# Patient Record
Sex: Female | Born: 1960 | Race: White | Hispanic: No | Marital: Married | Smoking: Never smoker
Health system: Southern US, Community
[De-identification: ages and names within clinical notes are randomized; demographics above are authoritative.]

## PROBLEM LIST (undated history)

## (undated) DIAGNOSIS — Z9889 Other specified postprocedural states: Secondary | ICD-10-CM

## (undated) DIAGNOSIS — N6019 Diffuse cystic mastopathy of unspecified breast: Secondary | ICD-10-CM

## (undated) DIAGNOSIS — IMO0002 Reserved for concepts with insufficient information to code with codable children: Secondary | ICD-10-CM

## (undated) DIAGNOSIS — I499 Cardiac arrhythmia, unspecified: Secondary | ICD-10-CM

## (undated) DIAGNOSIS — Z803 Family history of malignant neoplasm of breast: Secondary | ICD-10-CM

## (undated) DIAGNOSIS — Z9289 Personal history of other medical treatment: Secondary | ICD-10-CM

## (undated) HISTORY — PX: BOWEL RESECTION: SHX1257

## (undated) HISTORY — DX: Diffuse cystic mastopathy of unspecified breast: N60.19

## (undated) HISTORY — PX: PARTIAL HYSTERECTOMY: SHX80

## (undated) HISTORY — PX: ROTATOR CUFF REPAIR: SHX139

## (undated) HISTORY — PX: BREAST BIOPSY: SHX20

## (undated) HISTORY — DX: Family history of malignant neoplasm of breast: Z80.3

## (undated) HISTORY — DX: Other specified postprocedural states: Z98.890

## (undated) HISTORY — DX: Reserved for concepts with insufficient information to code with codable children: IMO0002

## (undated) HISTORY — DX: Cardiac arrhythmia, unspecified: I49.9

## (undated) HISTORY — PX: KNEE SURGERY: SHX244

## (undated) HISTORY — DX: Personal history of other medical treatment: Z92.89

---

## 1998-05-27 ENCOUNTER — Ambulatory Visit (HOSPITAL_BASED_OUTPATIENT_CLINIC_OR_DEPARTMENT_OTHER): Admission: RE | Admit: 1998-05-27 | Discharge: 1998-05-27 | Payer: Self-pay | Admitting: Orthopedic Surgery

## 1998-12-06 ENCOUNTER — Encounter: Payer: Self-pay | Admitting: *Deleted

## 1998-12-06 ENCOUNTER — Ambulatory Visit (HOSPITAL_COMMUNITY): Admission: RE | Admit: 1998-12-06 | Discharge: 1998-12-06 | Payer: Self-pay | Admitting: *Deleted

## 2000-04-13 ENCOUNTER — Encounter: Admission: RE | Admit: 2000-04-13 | Discharge: 2000-04-13 | Payer: Self-pay | Admitting: Neurology

## 2000-04-13 ENCOUNTER — Encounter: Payer: Self-pay | Admitting: Neurology

## 2001-07-21 ENCOUNTER — Encounter: Admission: RE | Admit: 2001-07-21 | Discharge: 2001-07-21 | Payer: Self-pay | Admitting: Neurology

## 2001-07-21 ENCOUNTER — Encounter: Payer: Self-pay | Admitting: Neurology

## 2001-11-19 ENCOUNTER — Emergency Department (HOSPITAL_COMMUNITY): Admission: EM | Admit: 2001-11-19 | Discharge: 2001-11-19 | Payer: Self-pay

## 2002-10-12 ENCOUNTER — Encounter: Admission: RE | Admit: 2002-10-12 | Discharge: 2002-10-12 | Payer: Self-pay | Admitting: Neurology

## 2002-10-12 ENCOUNTER — Encounter: Payer: Self-pay | Admitting: Neurology

## 2003-11-19 ENCOUNTER — Encounter: Admission: RE | Admit: 2003-11-19 | Discharge: 2003-11-19 | Payer: Self-pay | Admitting: Neurology

## 2004-09-18 ENCOUNTER — Encounter: Admission: RE | Admit: 2004-09-18 | Discharge: 2004-09-18 | Payer: Self-pay | Admitting: Neurology

## 2004-12-01 ENCOUNTER — Encounter: Admission: RE | Admit: 2004-12-01 | Discharge: 2004-12-01 | Payer: Self-pay | Admitting: Neurology

## 2005-06-08 ENCOUNTER — Encounter: Admission: RE | Admit: 2005-06-08 | Discharge: 2005-06-08 | Payer: Self-pay | Admitting: Neurology

## 2006-01-15 ENCOUNTER — Encounter: Admission: RE | Admit: 2006-01-15 | Discharge: 2006-01-15 | Payer: Self-pay | Admitting: Neurology

## 2006-10-05 ENCOUNTER — Encounter: Admission: RE | Admit: 2006-10-05 | Discharge: 2006-10-05 | Payer: Self-pay | Admitting: Specialist

## 2007-03-02 ENCOUNTER — Encounter: Admission: RE | Admit: 2007-03-02 | Discharge: 2007-03-02 | Payer: Self-pay | Admitting: Neurology

## 2007-11-01 ENCOUNTER — Encounter: Admission: RE | Admit: 2007-11-01 | Discharge: 2007-11-01 | Payer: Self-pay | Admitting: Specialist

## 2008-03-27 ENCOUNTER — Encounter: Admission: RE | Admit: 2008-03-27 | Discharge: 2008-03-27 | Payer: Self-pay | Admitting: Neurology

## 2008-04-06 ENCOUNTER — Encounter: Admission: RE | Admit: 2008-04-06 | Discharge: 2008-04-06 | Payer: Self-pay | Admitting: Neurology

## 2009-03-28 ENCOUNTER — Encounter: Admission: RE | Admit: 2009-03-28 | Discharge: 2009-03-28 | Payer: Self-pay | Admitting: Neurology

## 2009-04-01 ENCOUNTER — Encounter: Admission: RE | Admit: 2009-04-01 | Discharge: 2009-04-01 | Payer: Self-pay | Admitting: Neurology

## 2010-01-29 ENCOUNTER — Encounter: Admission: RE | Admit: 2010-01-29 | Discharge: 2010-01-29 | Payer: Self-pay | Admitting: Neurology

## 2010-10-21 ENCOUNTER — Encounter
Admission: RE | Admit: 2010-10-21 | Discharge: 2010-10-21 | Payer: Self-pay | Source: Home / Self Care | Attending: Family Medicine | Admitting: Family Medicine

## 2011-01-22 ENCOUNTER — Emergency Department (HOSPITAL_COMMUNITY): Payer: No Typology Code available for payment source

## 2011-01-22 ENCOUNTER — Emergency Department (HOSPITAL_COMMUNITY)
Admission: EM | Admit: 2011-01-22 | Discharge: 2011-01-22 | Disposition: A | Discharge status: Home / Self Care | Payer: No Typology Code available for payment source | Attending: Emergency Medicine | Admitting: Emergency Medicine

## 2011-01-22 DIAGNOSIS — IMO0002 Reserved for concepts with insufficient information to code with codable children: Secondary | ICD-10-CM | POA: Insufficient documentation

## 2011-01-22 DIAGNOSIS — M542 Cervicalgia: Secondary | ICD-10-CM | POA: Insufficient documentation

## 2011-01-22 DIAGNOSIS — M79609 Pain in unspecified limb: Secondary | ICD-10-CM | POA: Insufficient documentation

## 2011-01-22 DIAGNOSIS — S139XXA Sprain of joints and ligaments of unspecified parts of neck, initial encounter: Secondary | ICD-10-CM | POA: Insufficient documentation

## 2011-01-22 DIAGNOSIS — M545 Low back pain, unspecified: Secondary | ICD-10-CM | POA: Insufficient documentation

## 2011-01-22 DIAGNOSIS — M25519 Pain in unspecified shoulder: Secondary | ICD-10-CM | POA: Insufficient documentation

## 2011-01-22 DIAGNOSIS — S335XXA Sprain of ligaments of lumbar spine, initial encounter: Secondary | ICD-10-CM | POA: Insufficient documentation

## 2011-10-21 ENCOUNTER — Other Ambulatory Visit: Payer: Self-pay | Admitting: Family Medicine

## 2011-10-21 DIAGNOSIS — Z1231 Encounter for screening mammogram for malignant neoplasm of breast: Secondary | ICD-10-CM

## 2011-10-29 ENCOUNTER — Ambulatory Visit
Admission: RE | Admit: 2011-10-29 | Discharge: 2011-10-29 | Disposition: A | Discharge status: Home / Self Care | Payer: BC Managed Care – PPO | Source: Ambulatory Visit | Attending: Family Medicine | Admitting: Family Medicine

## 2011-10-29 DIAGNOSIS — Z1231 Encounter for screening mammogram for malignant neoplasm of breast: Secondary | ICD-10-CM

## 2012-01-05 ENCOUNTER — Other Ambulatory Visit: Payer: Self-pay | Admitting: Family Medicine

## 2012-01-05 DIAGNOSIS — N6009 Solitary cyst of unspecified breast: Secondary | ICD-10-CM

## 2012-01-06 ENCOUNTER — Ambulatory Visit
Admission: RE | Admit: 2012-01-06 | Discharge: 2012-01-06 | Disposition: A | Discharge status: Home / Self Care | Payer: BC Managed Care – PPO | Source: Ambulatory Visit | Attending: Family Medicine | Admitting: Family Medicine

## 2012-01-06 ENCOUNTER — Other Ambulatory Visit: Payer: Self-pay | Admitting: Family Medicine

## 2012-01-06 ENCOUNTER — Other Ambulatory Visit: Payer: Self-pay | Admitting: *Deleted

## 2012-01-06 DIAGNOSIS — N6009 Solitary cyst of unspecified breast: Secondary | ICD-10-CM

## 2012-01-08 ENCOUNTER — Other Ambulatory Visit: Payer: BC Managed Care – PPO

## 2012-03-23 ENCOUNTER — Other Ambulatory Visit: Payer: Self-pay | Admitting: Family Medicine

## 2012-03-23 DIAGNOSIS — N63 Unspecified lump in unspecified breast: Secondary | ICD-10-CM

## 2012-03-30 ENCOUNTER — Ambulatory Visit
Admission: RE | Admit: 2012-03-30 | Discharge: 2012-03-30 | Disposition: A | Discharge status: Home / Self Care | Payer: BC Managed Care – PPO | Source: Ambulatory Visit | Attending: Family Medicine | Admitting: Family Medicine

## 2012-03-30 DIAGNOSIS — N63 Unspecified lump in unspecified breast: Secondary | ICD-10-CM

## 2012-11-21 ENCOUNTER — Other Ambulatory Visit: Payer: Self-pay | Admitting: Neurology

## 2012-11-21 DIAGNOSIS — Z1231 Encounter for screening mammogram for malignant neoplasm of breast: Secondary | ICD-10-CM

## 2012-12-20 ENCOUNTER — Ambulatory Visit
Admission: RE | Admit: 2012-12-20 | Discharge: 2012-12-20 | Disposition: A | Discharge status: Home / Self Care | Payer: BC Managed Care – PPO | Source: Ambulatory Visit | Attending: Neurology | Admitting: Neurology

## 2012-12-20 DIAGNOSIS — Z1231 Encounter for screening mammogram for malignant neoplasm of breast: Secondary | ICD-10-CM

## 2013-05-09 HISTORY — PX: COLONOSCOPY: SHX5424

## 2013-09-08 ENCOUNTER — Encounter: Payer: Self-pay | Admitting: Interventional Cardiology

## 2013-09-08 ENCOUNTER — Telehealth: Payer: Self-pay | Admitting: Interventional Cardiology

## 2013-11-07 ENCOUNTER — Encounter: Payer: Self-pay | Admitting: Cardiology

## 2013-11-07 DIAGNOSIS — D518 Other vitamin B12 deficiency anemias: Secondary | ICD-10-CM

## 2013-11-07 DIAGNOSIS — M542 Cervicalgia: Secondary | ICD-10-CM | POA: Insufficient documentation

## 2013-11-07 DIAGNOSIS — I480 Paroxysmal atrial fibrillation: Secondary | ICD-10-CM | POA: Insufficient documentation

## 2013-11-07 DIAGNOSIS — I4891 Unspecified atrial fibrillation: Secondary | ICD-10-CM

## 2013-11-20 ENCOUNTER — Telehealth: Payer: Self-pay | Admitting: Interventional Cardiology

## 2013-11-20 ENCOUNTER — Ambulatory Visit (INDEPENDENT_AMBULATORY_CARE_PROVIDER_SITE_OTHER): Payer: BC Managed Care – PPO | Admitting: Interventional Cardiology

## 2013-11-20 ENCOUNTER — Encounter: Payer: Self-pay | Admitting: Interventional Cardiology

## 2013-11-20 ENCOUNTER — Encounter (INDEPENDENT_AMBULATORY_CARE_PROVIDER_SITE_OTHER): Payer: Self-pay

## 2013-11-20 VITALS — BP 128/77 | HR 76 | Ht 72.0 in | Wt 155.8 lb

## 2013-11-20 DIAGNOSIS — I4891 Unspecified atrial fibrillation: Secondary | ICD-10-CM

## 2013-11-20 DIAGNOSIS — R079 Chest pain, unspecified: Secondary | ICD-10-CM

## 2013-11-20 DIAGNOSIS — R42 Dizziness and giddiness: Secondary | ICD-10-CM

## 2013-11-20 MED ORDER — DILTIAZEM HCL ER COATED BEADS 180 MG PO CP24: 180.0000 mg | ORAL_CAPSULE | Freq: Every day | ORAL | Status: DC

## 2013-11-20 NOTE — Telephone Encounter (Signed)
Pt reports her "heart has been acting up" last couple weeks. She describes driving in car last Friday when her vision became "white blurry" and her heart "felt like it flipped over in my chest". She immediately had a severe headache and pulled off the road. Currently she is not symptomatic. Moved her annual appointment from later this week to today with Dr. Eldridge Dace, 2:15 pm

## 2013-11-20 NOTE — Telephone Encounter (Signed)
New message    C/o blurred vision on Friday. Happen when she was on highway. Should her appt be move up .

## 2013-11-20 NOTE — Patient Instructions (Signed)
Your physician recommends that you continue on your current medications as directed. Please refer to the Current Medication list given to you today.  I sent in a 90 day supply with 3 Refills for your Cartia XT. It should be ready for you to pick up later today.  Your physician wants you to follow-up in: 1 Year with Dr Lyn Hollingshead will receive a reminder letter in the mail two months in advance. If you don't receive a letter, please call our office to schedule the follow-up appointment.

## 2013-11-20 NOTE — Telephone Encounter (Signed)
Printed this and last OV for Dr Eldridge Dace. To Amy To make aware we saw pt today

## 2013-11-20 NOTE — Progress Notes (Signed)
Patient ID: Melissa Fritz, female   DOB: April 11, 1961, 53 y.o.   MRN: [id number]    [address] [place], [place]  [address] Phone: [phone] Fax:  [phone]  Date:  11/20/2013   ID:  Melissa, Fritz [birth year], MRN [id number]  PCP:  Allean Found, MD      History of Present Illness: TAHISHA Fritz is a 53 y.o. female who has had AFib in the past, and other arrhythmias when she was very young. She was hospitalized at a young age for arrhythmic issues. Sx have been under control. If she misses a medicine, she will have a few minutes of palpitations. No syncope or lightheadedness. She has been under a lot of stress recently. SHe has a history of arrhythmia dating back to many years ago. Atrial Fibrillation F/U:  c/o Dizziness while getting up from sitting position.  Denies : Chest pain.  Leg edema.  Orthopnea.  Shortness of breath.  Syncope.    She had one episode of palpitations.  It feels like her heart flips in her chest.  She had an episode associated with blurry vision and lightheadedness.  After a few minutes, she felt normal.  She hfeels some pressure in her chest as well with walking up stairs.  She has not had a prolonged episode of AFib.     Wt Readings from Last 3 Encounters:  11/20/13 155 lb 12.8 oz (70.67 kg)     Past Medical History  Diagnosis Date  . Family history of breast cancer   . Fibrocystic breast disease   . Arrhythmia     afib  . DDD (degenerative disc disease)   . History of colonoscopy     Current Outpatient Prescriptions  Medication Sig Dispense Refill  . aspirin 81 MG tablet Take 81 mg by mouth daily.      . Cyanocobalamin (VITAMIN B-12) 1000 MCG/15ML LIQD Take 1 mL by mouth every 21 ( twenty-one) days.      Marland Kitchen diltiazem (CARTIA XT) 180 MG 24 hr capsule Take 180 mg by mouth daily.      Marland Kitchen HYDROcodone-acetaminophen (NORCO/VICODIN) 5-325 MG per tablet Take 1 tablet by mouth every 6 (six) hours as needed for moderate pain.       . Multiple Vitamins-Minerals (MULTIVITAMIN PO) Take by mouth daily.      . vitamin E 400 UNIT capsule Take 400 Units by mouth daily.       No current facility-administered medications for this visit.    Allergies:    Allergies  Allergen Reactions  . Demerol [Meperidine] Itching    Social History:  The patient  reports that she has never smoked. She does not have any smokeless tobacco history on file. She reports that she drinks alcohol. She reports that she does not use illicit drugs.   Family History:  The patient's family history includes Atrial fibrillation in her brother; Breast cancer in her maternal grandmother and mother; CVA in her paternal grandfather; Glaucoma in her father; Heart disease in her brother and brother; Polymyalgia rheumatica in her mother and sister.   ROS:  Please see the history of present illness.  No nausea, vomiting.  No fevers, chills.  No focal weakness.  No dysuria. One episode of palpitations.   All other systems reviewed and negative.   PHYSICAL EXAM: VS:  BP 128/77  Pulse 76  Ht 6' (1.829 m)  Wt 155 lb 12.8 oz (70.67 kg)  BMI 21.13 kg/m2 Well  nourished, well developed, in no acute distress HEENT: normal Neck: no JVD, no carotid bruits Cardiac:  normal S1, S2; RRR;  Lungs:  clear to auscultation bilaterally, no wheezing, rhonchi or rales Abd: soft, nontender, no hepatomegaly Ext: no edema Skin: warm and dry Neuro:   no focal abnormalities noted  EKG:   Normal sinus rhythm, no ST segment changes  ASSESSMENT AND PLAN:  Atrial fibrillation  Refill Cartia XT Capsule Extended Release 24 Hour, 180 MG, 1 capsule, Orally, Once a day, 90 days, 90, Refills 3 Continue Aspirin EC Lo-Dose Tablet Delayed Release, 81 MG, 1 tablet, Orally, Once a day, 30 day(s), 30 Sx are better controlled, except for the episode described above. We discussed a referral to Dr. Johney Frame for further. Would minimize caffeine and alcohol. She will think about seeing Dr.  Johney Frame. She will continue to monitor sx and let us know.   Lightheadedness: PVCs most likely.  Unclear why she had blurry vision unless there were a few PVCs strung together. It resolves spontaneously. She has a warning. She knows to try to sit down or pull off to the side of the road, depending on what she is doing when this symptom happens.  We discussed putting a monitor on her. She is not interested at this time.  Chest pressure: She has felt a little short of breath and some chest pressure while walking upstairs. She recently underwent a procedure for lower back trouble. Her walking has been limited since that procedure. She is trying to get back into more walking these days.  She is not concerned about this symptom at this time. She is not interested in any further workup. She is mostly concerned about the episode of palpitations with lightheadedness described above.   Signed, Fredric Mare, MD, [facility] 11/20/2013 3:02 PM

## 2013-11-21 ENCOUNTER — Ambulatory Visit: Payer: BC Managed Care – PPO | Admitting: Interventional Cardiology

## 2013-11-22 ENCOUNTER — Ambulatory Visit: Payer: BC Managed Care – PPO | Admitting: Interventional Cardiology

## 2014-01-11 ENCOUNTER — Other Ambulatory Visit: Payer: Self-pay

## 2014-01-11 DIAGNOSIS — Z1231 Encounter for screening mammogram for malignant neoplasm of breast: Secondary | ICD-10-CM

## 2014-01-16 ENCOUNTER — Ambulatory Visit
Admission: RE | Admit: 2014-01-16 | Discharge: 2014-01-16 | Disposition: A | Discharge status: Home / Self Care | Payer: BC Managed Care – PPO | Source: Ambulatory Visit

## 2014-01-16 DIAGNOSIS — Z1231 Encounter for screening mammogram for malignant neoplasm of breast: Secondary | ICD-10-CM

## 2014-02-16 ENCOUNTER — Encounter (HOSPITAL_COMMUNITY): Payer: Self-pay | Admitting: Emergency Medicine

## 2014-02-16 ENCOUNTER — Emergency Department (INDEPENDENT_AMBULATORY_CARE_PROVIDER_SITE_OTHER)
Admission: EM | Admit: 2014-02-16 | Discharge: 2014-02-16 | Disposition: A | Discharge status: Home / Self Care | Payer: BC Managed Care – PPO | Source: Home / Self Care | Attending: Family Medicine | Admitting: Family Medicine

## 2014-02-16 DIAGNOSIS — J329 Chronic sinusitis, unspecified: Secondary | ICD-10-CM

## 2014-02-16 DIAGNOSIS — J020 Streptococcal pharyngitis: Secondary | ICD-10-CM

## 2014-02-16 LAB — POCT RAPID STREP A: Streptococcus, Group A Screen (Direct): POSITIVE — AB

## 2014-02-16 MED ORDER — AMOXICILLIN 875 MG PO TABS: 875.0000 mg | ORAL_TABLET | Freq: Two times a day (BID) | ORAL | Status: DC

## 2014-02-16 NOTE — ED Provider Notes (Signed)
Medical screening examination/treatment/procedure(s) were performed by resident physician or non-physician practitioner and as supervising physician I was immediately available for consultation/collaboration.   Barkley Bruns MD.   Linna Hoff, MD 02/16/14 1357

## 2014-02-16 NOTE — ED Provider Notes (Signed)
CSN: [id number]     Arrival date & time 02/16/14  1210 History   First MD Initiated Contact with Patient 02/16/14 1346     Chief Complaint  Patient presents with  . Sore Throat   (Consider location/radiation/quality/duration/timing/severity/associated sxs/prior Treatment) HPI Comments: C/o green sputum  Patient is a 53 y.o. female presenting with pharyngitis and URI. The history is provided by the patient.  Sore Throat This is a new problem. The current episode started more than 2 days ago. The problem occurs constantly. The problem has been gradually worsening. Pertinent negatives include no abdominal pain and no shortness of breath. The symptoms are aggravated by swallowing. Nothing relieves the symptoms. She has tried nothing for the symptoms.  URI Presenting symptoms: congestion, cough and sore throat   Presenting symptoms: no ear pain, no facial pain, no fever and no rhinorrhea   Severity:  Severe Onset quality:  Gradual Duration:  1 week Timing:  Constant Progression:  Worsening Chronicity:  New Relieved by:  None tried Worsened by:  Nothing tried Ineffective treatments:  None tried Associated symptoms: sinus pain   Risk factors: sick contacts     Past Medical History  Diagnosis Date  . Family history of breast cancer   . Fibrocystic breast disease   . Arrhythmia     afib  . DDD (degenerative disc disease)   . History of colonoscopy    Past Surgical History  Procedure Laterality Date  . Knee surgery Right   . Rotator cuff repair    . Partial hysterectomy      one ovary remains  . Bowel resection      adhesions causing bloackage  . Colonoscopy  7/14   Family History  Problem Relation Age of Onset  . Breast cancer Mother     survivor  . Polymyalgia rheumatica Mother   . Glaucoma Father   . Polymyalgia rheumatica Sister   . Heart disease Brother   . Atrial fibrillation Brother   . Breast cancer Maternal Grandmother   . CVA Paternal Grandfather   . Heart  disease Brother    History  Substance Use Topics  . Smoking status: Never Smoker   . Smokeless tobacco: Not on file  . Alcohol Use: Yes   OB History   Grav Para Term Preterm Abortions TAB SAB Ect Mult Living                 Review of Systems  Constitutional: Positive for chills. Negative for fever.  HENT: Positive for congestion, postnasal drip, sinus pressure and sore throat. Negative for ear pain and rhinorrhea.   Respiratory: Positive for cough. Negative for shortness of breath.   Gastrointestinal: Negative for abdominal pain.    Allergies  Demerol  Home Medications   Current Outpatient Rx  Name  Route  Sig  Dispense  Refill  . aspirin 81 MG tablet   Oral   Take 81 mg by mouth daily.         Marland Kitchen diltiazem (CARTIA XT) 180 MG 24 hr capsule   Oral   Take 1 capsule (180 mg total) by mouth daily.   90 capsule   3   . HYDROcodone-acetaminophen (NORCO/VICODIN) 5-325 MG per tablet   Oral   Take 1 tablet by mouth every 6 (six) hours as needed for moderate pain.         . Multiple Vitamins-Minerals (MULTIVITAMIN PO)   Oral   Take by mouth daily.         Marland Kitchen  vitamin E 400 UNIT capsule   Oral   Take 400 Units by mouth daily.         Marland Kitchen amoxicillin (AMOXIL) 875 MG tablet   Oral   Take 1 tablet (875 mg total) by mouth 2 (two) times daily.   20 tablet   0   . Cyanocobalamin (VITAMIN B-12) 1000 MCG/15ML LIQD   Oral   Take 1 mL by mouth every 21 ( twenty-one) days.          BP 126/64  Pulse 82  Temp(Src) 98.8 F (37.1 C) (Oral)  Resp 18  SpO2 100% Physical Exam  Constitutional: She appears well-developed and well-nourished. No distress.  HENT:  Right Ear: Tympanic membrane, external ear and ear canal normal.  Left Ear: Tympanic membrane, external ear and ear canal normal.  Nose: Mucosal edema present. Right sinus exhibits maxillary sinus tenderness and frontal sinus tenderness. Left sinus exhibits maxillary sinus tenderness and frontal sinus tenderness.   Mouth/Throat: Mucous membranes are normal. Posterior oropharyngeal edema and posterior oropharyngeal erythema present. No oropharyngeal exudate.  R tonsil larger than L  Cardiovascular: Normal rate and regular rhythm.   Pulmonary/Chest: Effort normal and breath sounds normal.  Occasional non productive cough during exam  Lymphadenopathy:       Head (right side): Submandibular adenopathy present.       Head (left side): Submandibular adenopathy present.    She has no cervical adenopathy.  R submandibular lymph node more enlarged and tender than L     ED Course  Procedures (including critical care time) Labs Review Labs Reviewed  POCT RAPID STREP A ([facility]RG CARE ONLY) - Abnormal; Notable for the following:    Streptococcus, Group A Screen (Direct) POSITIVE (*)    All other components within normal limits   Imaging Review No results found.   MDM   1. Strep pharyngitis   2. Sinusitis   rx amoxicillin 875mg  BID #20. Pt is leaving for vacation today. If no improvement in next 2 days or if worsens, pt to seek help at urgent care at destination.      , NP 02/16/14 1352

## 2014-02-16 NOTE — Discharge Instructions (Signed)
Change your toothbrush in 2 days to prevent re-infection.   If you are not dramatically better in 2 days, or if you feel your cough is much worse, go to an urgent care that has x-ray capabilities while you are out of town to get checked again.    Strep Throat Strep throat is an infection of the throat caused by a bacteria named Streptococcus pyogenes. Your caregiver may call the infection streptococcal "tonsillitis" or "pharyngitis" depending on whether there are signs of inflammation in the tonsils or back of the throat. Strep throat is most common in children aged 5 15 years during the cold months of the year, but it can occur in people of any age during any season. This infection is spread from person to person (contagious) through coughing, sneezing, or other close contact. SYMPTOMS   Fever or chills.  Painful, swollen, red tonsils or throat.  Pain or difficulty when swallowing.  White or yellow spots on the tonsils or throat.  Swollen, tender lymph nodes or "glands" of the neck or under the jaw.  Red rash all over the body (rare). DIAGNOSIS  Many different infections can cause the same symptoms. A test must be done to confirm the diagnosis so the right treatment can be given. A "rapid strep test" can help your caregiver make the diagnosis in a few minutes. If this test is not available, a light swab of the infected area can be used for a throat culture test. If a throat culture test is done, results are usually available in a day or two. TREATMENT  Strep throat is treated with antibiotic medicine. HOME CARE INSTRUCTIONS   Gargle with 1 tsp of salt in 1 cup of warm water, 3 4 times per day or as needed for comfort.  Family members who also have a sore throat or fever should be tested for strep throat and treated with antibiotics if they have the strep infection.  Make sure everyone in your household washes their hands well.  Do not share food, drinking cups, or personal items that  could cause the infection to spread to others.  You may need to eat a soft food diet until your sore throat gets better.  Drink enough water and fluids to keep your urine clear or pale yellow. This will help prevent dehydration.  Get plenty of rest.  Stay home from school, daycare, or work until you have been on antibiotics for 24 hours.  Only take over-the-counter or prescription medicines for pain, discomfort, or fever as directed by your caregiver.  If antibiotics are prescribed, take them as directed. Finish them even if you start to feel better. SEEK MEDICAL CARE IF:   The glands in your neck continue to enlarge.  You develop a rash, cough, or earache.  You cough up green, yellow-brown, or bloody sputum.  You have pain or discomfort not controlled by medicines.  Your problems seem to be getting worse rather than better. SEEK IMMEDIATE MEDICAL CARE IF:   You develop any new symptoms such as vomiting, severe headache, stiff or painful neck, chest pain, shortness of breath, or trouble swallowing.  You develop severe throat pain, drooling, or changes in your voice.  You develop swelling of the neck, or the skin on the neck becomes red and tender.  You have a fever.  You develop signs of dehydration, such as fatigue, dry mouth, and decreased urination.  You become increasingly sleepy, or you cannot wake up completely. Document Released: 10/23/2000 Document Revised:  10/12/2012 Document Reviewed: 12/25/2010 [employer] Patient Information 2014 [place], [place].  Sinusitis Sinusitis is redness, soreness, and swelling (inflammation) of the paranasal sinuses. Paranasal sinuses are air pockets within the bones of your face (beneath the eyes, the middle of the forehead, or above the eyes). In healthy paranasal sinuses, mucus is able to drain out, and air is able to circulate through them by way of your nose. However, when your paranasal sinuses are inflamed, mucus and air can become  trapped. This can allow bacteria and other germs to grow and cause infection. Sinusitis can develop quickly and last only a short time (acute) or continue over a long period (chronic). Sinusitis that lasts for more than 12 weeks is considered chronic.  CAUSES  Causes of sinusitis include:  Allergies.  Structural abnormalities, such as displacement of the cartilage that separates your nostrils (deviated septum), which can decrease the air flow through your nose and sinuses and affect sinus drainage.  Functional abnormalities, such as when the small hairs (cilia) that line your sinuses and help remove mucus do not work properly or are not present. SYMPTOMS  Symptoms of acute and chronic sinusitis are the same. The primary symptoms are pain and pressure around the affected sinuses. Other symptoms include:  Upper toothache.  Earache.  Headache.  Bad breath.  Decreased sense of smell and taste.  A cough, which worsens when you are lying flat.  Fatigue.  Fever.  Thick drainage from your nose, which often is green and may contain pus (purulent).  Swelling and warmth over the affected sinuses. DIAGNOSIS  Your caregiver will perform a physical exam. During the exam, your caregiver may:  Look in your nose for signs of abnormal growths in your nostrils (nasal polyps).  Tap over the affected sinus to check for signs of infection.  View the inside of your sinuses (endoscopy) with a special imaging device with a light attached (endoscope), which is inserted into your sinuses. If your caregiver suspects that you have chronic sinusitis, one or more of the following tests may be recommended:  Allergy tests.  Nasal culture A sample of mucus is taken from your nose and sent to a lab and screened for bacteria.  Nasal cytology A sample of mucus is taken from your nose and examined by your caregiver to determine if your sinusitis is related to an allergy. TREATMENT  Most cases of acute  sinusitis are related to a viral infection and will resolve on their own within 10 days. Sometimes medicines are prescribed to help relieve symptoms (pain medicine, decongestants, nasal steroid sprays, or saline sprays).  However, for sinusitis related to a bacterial infection, your caregiver will prescribe antibiotic medicines. These are medicines that will help kill the bacteria causing the infection.  Rarely, sinusitis is caused by a fungal infection. In theses cases, your caregiver will prescribe antifungal medicine. For some cases of chronic sinusitis, surgery is needed. Generally, these are cases in which sinusitis recurs more than 3 times per year, despite other treatments. HOME CARE INSTRUCTIONS   Drink plenty of water. Water helps thin the mucus so your sinuses can drain more easily.  Use a humidifier.  Inhale steam 3 to 4 times a day (for example, sit in the bathroom with the shower running).  Apply a warm, moist washcloth to your face 3 to 4 times a day, or as directed by your caregiver.  Use saline nasal sprays to help moisten and clean your sinuses.  Take over-the-counter or prescription medicines for pain,  discomfort, or fever only as directed by your caregiver. SEEK IMMEDIATE MEDICAL CARE IF:  You have increasing pain or severe headaches.  You have nausea, vomiting, or drowsiness.  You have swelling around your face.  You have vision problems.  You have a stiff neck.  You have difficulty breathing. MAKE SURE YOU:   Understand these instructions.  Will watch your condition.  Will get help right away if you are not doing well or get worse. Document Released: 10/26/2005 Document Revised: 01/18/2012 Document Reviewed: 11/10/2011 [facility] Patient Information 2014 [place], [place].

## 2014-02-16 NOTE — ED Notes (Signed)
C/o  Body aches.  Sore throat.  Productive cough with dark green sputum.   Low grade temp.   Pt has not tried any otc meds for symptoms.

## 2014-03-25 ENCOUNTER — Other Ambulatory Visit: Payer: Self-pay | Admitting: Interventional Cardiology

## 2014-05-18 NOTE — Telephone Encounter (Signed)
Closed encounter °

## 2014-10-27 ENCOUNTER — Encounter: Payer: Self-pay | Admitting: *Deleted

## 2014-11-28 ENCOUNTER — Ambulatory Visit (INDEPENDENT_AMBULATORY_CARE_PROVIDER_SITE_OTHER): Payer: BLUE CROSS/BLUE SHIELD | Admitting: Interventional Cardiology

## 2014-11-28 ENCOUNTER — Encounter: Payer: Self-pay | Admitting: Interventional Cardiology

## 2014-11-28 VITALS — BP 130/86 | HR 91 | Ht 72.0 in | Wt 159.0 lb

## 2014-11-28 DIAGNOSIS — I4891 Unspecified atrial fibrillation: Secondary | ICD-10-CM

## 2014-11-28 MED ORDER — DILTIAZEM HCL ER COATED BEADS 180 MG PO CP24: 180.0000 mg | ORAL_CAPSULE | Freq: Every day | ORAL | Status: DC

## 2014-11-28 NOTE — Patient Instructions (Signed)
Your physician recommends that you continue on your current medications as directed. Please refer to the Current Medication list given to you today.  Your physician wants you to follow-up in: 1 year with Dr. Varanasi. You will receive a reminder letter in the mail two months in advance. If you don't receive a letter, please call our office to schedule the follow-up appointment.  

## 2014-11-28 NOTE — Progress Notes (Signed)
Patient ID: Melissa Fritz, female   DOB: 06-27-1961, 54 y.o.   MRN: [id number]    [address] [place], [place]  [address] Phone: [phone] Fax:  [phone]  Date:  11/28/2014   ID:  Melissa Fritz 1960-12-04, MRN [id number]  PCP:  Allean Found, MD      History of Present Illness: Melissa Fritz is a 54 y.o. female who has had AFib in the past, and other arrhythmias when she was very young. She was hospitalized at a young age for arrhythmic issues. Sx have been under control. If she misses a medicine, she will have a few minutes of palpitations. No syncope or lightheadedness. SHe has a history of arrhythmia dating back to many years ago. Atrial Fibrillation F/U:  c/o Dizziness while getting up from sitting position.  Denies : Chest pain.  Leg edema.  Orthopnea.  Shortness of breath.  Syncope.    She had a spinal fusion in late 2015.  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  Walking and standing are better after the surgery.  No arrhythmia during the perioperative period.     She had one episode of palpitations.  It feels like her heart flips in her chest a week ago. It resolved on its own.  No blurry vision or lightheadedness.  After a few minutes, she felt normal.  She has not felt pressure in her chest with exertion .  She has not had a prolonged episode of AFib.  No bleeding problems.    Wt Readings from Last 3 Encounters:  11/28/14 159 lb (72.122 kg)  11/20/13 155 lb 12.8 oz (70.67 kg)     Past Medical History  Diagnosis Date  . Family history of breast cancer   . Fibrocystic breast disease   . Arrhythmia     afib  . DDD (degenerative disc disease)   . History of colonoscopy     Current Outpatient Prescriptions  Medication Sig Dispense Refill  . amoxicillin (AMOXIL) 875 MG tablet Take 1 tablet (875 mg total) by mouth 2 (two) times daily. (Patient taking differently: Take 875 mg by mouth as needed (Before Dental  appointments). ) 20 tablet 0  . aspirin 81 MG tablet Take 81 mg by mouth daily.    . Cyanocobalamin (VITAMIN B-12) 1000 MCG/15ML LIQD Take 1 mL by mouth every 21 ( twenty-one) days.    Marland Kitchen diltiazem (CARTIA XT) 180 MG 24 hr capsule Take 1 capsule (180 mg total) by mouth daily. 90 capsule 3  . HYDROcodone-acetaminophen (NORCO/VICODIN) 5-325 MG per tablet Take 1 tablet by mouth every 6 (six) hours as needed for moderate pain.    . Multiple Vitamins-Minerals (MULTIVITAMIN PO) Take by mouth daily.    . vitamin E 400 UNIT capsule Take 400 Units by mouth daily.     No current facility-administered medications for this visit.    Allergies:    Allergies  Allergen Reactions  . Demerol [Meperidine] Itching    Social History:  The patient  reports that she has never smoked. She does not have any smokeless tobacco history on file. She reports that she drinks alcohol. She reports that she does not use illicit drugs.   Family History:  The patient's family history includes Atrial fibrillation in her brother; Breast cancer in her maternal grandmother and mother; CVA in her paternal grandfather; Glaucoma in her father; Heart disease in her brother and brother; Polymyalgia rheumatica in her  mother and sister.   ROS:  Please see the history of present illness.  No nausea, vomiting.  No fevers, chills.  No focal weakness.  No dysuria. One episode of palpitations.   All other systems reviewed and negative.   PHYSICAL EXAM: VS:  BP 130/86 mmHg  Pulse 91  Ht 6' (1.829 m)  Wt 159 lb (72.122 kg)  BMI 21.56 kg/m2 Well nourished, well developed, in no acute distress HEENT: normal Neck: no JVD, no carotid bruits Cardiac:  normal S1, S2; RRR;  Lungs:  clear to auscultation bilaterally, no wheezing, rhonchi or rales Abd: soft, nontender, no hepatomegaly Ext: no edema Skin: warm and dry Neuro:   no focal abnormalities noted Psych: anxious affect  EKG:   Normal sinus rhythm, no ST segment  changes  ASSESSMENT AND PLAN:  Atrial fibrillation  Refill Cartia XT Capsule Extended Release 24 Hour, 180 MG, 1 capsule, Orally, Once a day, 90 days, 90, Refills 3 Continue Aspirin EC Lo-Dose Tablet Delayed Release, 81 MG, 1 tablet, Orally, Once a day, 30 day(s), 30 Sx are better controlled, except for the episode described above. We discussed a referral to Dr. Johney Frame for further. Would minimize caffeine and alcohol. She will think about seeing Dr. Johney Frame. She will continue to monitor sx and let us know.   Mitral and aortic regurgitation: trace to mild on 2012 echo.  No need for SBE prophylaxis.     She recently underwent a procedure for lower back trouble. Her walking has been limited since that procedure. She is trying to get back into more walking these days.  She is not concerned about this symptom at this time. She is not interested in any further workup. She is mostly concerned about the episode of palpitations with lightheadedness described above.   Signed, Fredric Mare, MD, [facility] 11/28/2014 9:31 AM

## 2014-12-17 DIAGNOSIS — H40003 Preglaucoma, unspecified, bilateral: Secondary | ICD-10-CM | POA: Insufficient documentation

## 2014-12-19 ENCOUNTER — Emergency Department (HOSPITAL_COMMUNITY): Payer: BLUE CROSS/BLUE SHIELD

## 2014-12-19 ENCOUNTER — Encounter (HOSPITAL_COMMUNITY): Payer: Self-pay | Admitting: Emergency Medicine

## 2014-12-19 ENCOUNTER — Emergency Department (HOSPITAL_COMMUNITY)
Admission: EM | Admit: 2014-12-19 | Discharge: 2014-12-19 | Disposition: A | Discharge status: Home / Self Care | Payer: BLUE CROSS/BLUE SHIELD | Attending: Emergency Medicine | Admitting: Emergency Medicine

## 2014-12-19 DIAGNOSIS — Y9389 Activity, other specified: Secondary | ICD-10-CM | POA: Diagnosis not present

## 2014-12-19 DIAGNOSIS — Y9289 Other specified places as the place of occurrence of the external cause: Secondary | ICD-10-CM | POA: Diagnosis not present

## 2014-12-19 DIAGNOSIS — S20229A Contusion of unspecified back wall of thorax, initial encounter: Secondary | ICD-10-CM | POA: Insufficient documentation

## 2014-12-19 DIAGNOSIS — S59911A Unspecified injury of right forearm, initial encounter: Secondary | ICD-10-CM | POA: Insufficient documentation

## 2014-12-19 DIAGNOSIS — Z7951 Long term (current) use of inhaled steroids: Secondary | ICD-10-CM | POA: Diagnosis not present

## 2014-12-19 DIAGNOSIS — Z8679 Personal history of other diseases of the circulatory system: Secondary | ICD-10-CM | POA: Diagnosis not present

## 2014-12-19 DIAGNOSIS — Z9889 Other specified postprocedural states: Secondary | ICD-10-CM | POA: Insufficient documentation

## 2014-12-19 DIAGNOSIS — Y998 Other external cause status: Secondary | ICD-10-CM | POA: Diagnosis not present

## 2014-12-19 DIAGNOSIS — W108XXA Fall (on) (from) other stairs and steps, initial encounter: Secondary | ICD-10-CM | POA: Insufficient documentation

## 2014-12-19 DIAGNOSIS — Z792 Long term (current) use of antibiotics: Secondary | ICD-10-CM | POA: Diagnosis not present

## 2014-12-19 DIAGNOSIS — S3992XA Unspecified injury of lower back, initial encounter: Secondary | ICD-10-CM | POA: Diagnosis present

## 2014-12-19 DIAGNOSIS — Z8742 Personal history of other diseases of the female genital tract: Secondary | ICD-10-CM | POA: Insufficient documentation

## 2014-12-19 MED ORDER — HYDROMORPHONE HCL 1 MG/ML IJ SOLN
1.0000 mg | Freq: Once | INTRAMUSCULAR | Status: AC
  Administered 2014-12-19: 1 mg via INTRAMUSCULAR
  Filled 2014-12-19: qty 1

## 2014-12-19 MED ORDER — NAPROXEN 500 MG PO TABS: 500.0000 mg | ORAL_TABLET | Freq: Two times a day (BID) | ORAL | Status: DC

## 2014-12-19 MED ORDER — OXYCODONE-ACETAMINOPHEN 5-325 MG PO TABS: 1.0000 | ORAL_TABLET | ORAL | Status: DC | PRN

## 2014-12-19 NOTE — ED Notes (Signed)
Pt states that she was going down the stairs in just her socks and slipped down the last 3, landing "flat on her back".  Pt has large hematomas to bilateral forearms.  Had spinal fusion surgery last May (L5-S1).  C/o low back pain.

## 2014-12-19 NOTE — ED Notes (Signed)
Pt in xray

## 2014-12-19 NOTE — Discharge Instructions (Signed)
Contusion °A contusion is a deep bruise. Contusions are the result of an injury that caused bleeding under the skin. The contusion may turn blue, purple, or yellow. Minor injuries will give you a painless contusion, but more severe contusions may stay painful and swollen for a few weeks.  °CAUSES  °A contusion is usually caused by a blow, trauma, or direct force to an area of the body. °SYMPTOMS  °· Swelling and redness of the injured area. °· Bruising of the injured area. °· Tenderness and soreness of the injured area. °· Pain. °DIAGNOSIS  °The diagnosis can be made by taking a history and physical exam. An X-ray, CT scan, or MRI may be needed to determine if there were any associated injuries, such as fractures. °TREATMENT  °Specific treatment will depend on what area of the body was injured. In general, the best treatment for a contusion is resting, icing, elevating, and applying cold compresses to the injured area. Over-the-counter medicines may also be recommended for pain control. Ask your caregiver what the best treatment is for your contusion. °HOME CARE INSTRUCTIONS  °· Put ice on the injured area. °¨ Put ice in a plastic bag. °¨ Place a towel between your skin and the bag. °¨ Leave the ice on for 15-20 minutes, 3-4 times a day, or as directed by your health care provider. °· Only take over-the-counter or prescription medicines for pain, discomfort, or fever as directed by your caregiver. Your caregiver may recommend avoiding anti-inflammatory medicines (aspirin, ibuprofen, and naproxen) for 48 hours because these medicines may increase bruising. °· Rest the injured area. °· If possible, elevate the injured area to reduce swelling. °SEEK IMMEDIATE MEDICAL CARE IF:  °· You have increased bruising or swelling. °· You have pain that is getting worse. °· Your swelling or pain is not relieved with medicines. °MAKE SURE YOU:  °· Understand these instructions. °· Will watch your condition. °· Will get help right  away if you are not doing well or get worse. °Document Released: 08/05/2005 Document Revised: 10/31/2013 Document Reviewed: 08/31/2011 °[employer]® Patient Information ©2015 [employer]. This information is not intended to replace advice given to you by your health care provider. Make sure you discuss any questions you have with your health care provider. ° °

## 2014-12-19 NOTE — ED Notes (Signed)
Pt escorted to discharge window. Verbalized understanding discharge instructions. In no acute distress.   

## 2014-12-19 NOTE — ED Provider Notes (Addendum)
CSN: [id number]     Arrival date & time 12/19/14  0750 History   First MD Initiated Contact with Patient 12/19/14 0831     Chief Complaint  Patient presents with  . Fall  . Back Pain  . Arm Pain     (Consider location/radiation/quality/duration/timing/severity/associated sxs/prior Treatment) HPI Comments: 54 y/o female with hx of lumbar fusion at L5-S1 - this is a surgery that occurred approximately 8 months ago. She has done very well until this morning when she was walking downstairs with socks on, slipped and fell onto her lower back striking her lumbar spine and sacrum and falling down 4 stairs. She also bruised her bilateral forearms that she tried to break her fall with her elbows. She denies any numbness or weakness, she has been able to ambulate but states that she is feeling very tight in her lower back. Symptoms are persistent, worse with movement, not associated with numbness weakness or difficulty urinating. No head injury, no neck pain. She took 2 Norco tablets prior to arrival without improvement.  Patient is a 54 y.o. female presenting with fall, back pain, and arm pain. The history is provided by the patient.  Fall  Back Pain Associated symptoms: no fever, no numbness and no weakness   Arm Pain    Past Medical History  Diagnosis Date  . Family history of breast cancer   . Fibrocystic breast disease   . Arrhythmia     afib  . DDD (degenerative disc disease)   . History of colonoscopy    Past Surgical History  Procedure Laterality Date  . Knee surgery Right   . Rotator cuff repair    . Partial hysterectomy      one ovary remains  . Bowel resection      adhesions causing bloackage  . Colonoscopy  7/14   Family History  Problem Relation Age of Onset  . Breast cancer Mother     survivor  . Polymyalgia rheumatica Mother   . Glaucoma Father   . Polymyalgia rheumatica Sister   . Heart disease Brother   . Atrial fibrillation Brother   . Breast cancer Maternal  Grandmother   . CVA Paternal Grandfather   . Heart disease Brother    History  Substance Use Topics  . Smoking status: Never Smoker   . Smokeless tobacco: Not on file  . Alcohol Use: Yes   OB History    No data available     Review of Systems  Constitutional: Negative for fever and chills.  Cardiovascular: Negative for leg swelling.  Gastrointestinal: Negative for nausea and vomiting.       No incontinence of bowel  Genitourinary: Negative for difficulty urinating.       No incontinence or retention  Musculoskeletal: Positive for back pain. Negative for neck pain.  Skin: Negative for rash.  Neurological: Negative for weakness and numbness.      Allergies  Demerol  Home Medications   Prior to Admission medications   Medication Sig Start Date End Date Taking? Authorizing Provider  amoxicillin (AMOXIL) 875 MG tablet Take 1 tablet (875 mg total) by mouth 2 (two) times daily. Patient taking differently: Take 875 mg by mouth as needed (Before Dental appointments).  02/16/14   Cathlyn Parsons, NP  aspirin 81 MG tablet Take 81 mg by mouth daily.    Historical Provider, MD  Cyanocobalamin (VITAMIN B-12) 1000 MCG/15ML LIQD Take 1 mL by mouth every 21 ( twenty-one) days.    Historical  Provider, MD  diltiazem (CARTIA XT) 180 MG 24 hr capsule Take 1 capsule (180 mg total) by mouth daily. 11/28/14   Corky Crafts, MD  HYDROcodone-acetaminophen (NORCO/VICODIN) 5-325 MG per tablet Take 1 tablet by mouth every 6 (six) hours as needed for moderate pain.    Historical Provider, MD  Multiple Vitamins-Minerals (MULTIVITAMIN PO) Take by mouth daily.    Historical Provider, MD  naproxen (NAPROSYN) 500 MG tablet Take 1 tablet (500 mg total) by mouth 2 (two) times daily with a meal. 12/19/14   Vida Roller, MD  oxyCODONE-acetaminophen (PERCOCET) 5-325 MG per tablet Take 1 tablet by mouth every 4 (four) hours as needed. 12/19/14   Vida Roller, MD  vitamin E 400 UNIT capsule Take 400 Units by  mouth daily.    Historical Provider, MD   BP 143/87 mmHg  Pulse 87  Temp(Src) 98.2 F (36.8 C) (Oral)  Resp 18  SpO2 100% Physical Exam  Constitutional: She appears well-developed and well-nourished. No distress.  HENT:  Head: Normocephalic and atraumatic.  Eyes: Conjunctivae are normal. Right eye exhibits no discharge. Left eye exhibits no discharge. No scleral icterus.  Cardiovascular: Normal rate and regular rhythm.   Pulmonary/Chest: Effort normal and breath sounds normal.  Musculoskeletal: She exhibits tenderness ( Tenderness and hematomas present to the bilateral extensor surface of the forearms in the proximal third.). She exhibits no edema.  Tenderness of the back over the sacrum No tenderness over the Cervical, Thoracic or Lumbar Spine  Neurological:  Speech is clear, strength in the UE and LE's are normal at the major muscle groups including the hip, knee and ankles.  Sensation in tact to light touch and pin prick of the bilateral LE's.  Normal reflexes at the knees bilaterally.  Gait antalgic secondary to pain   Skin: Skin is warm and dry. No rash noted. She is not diaphoretic.    ED Course  Procedures (including critical care time) Labs Review Labs Reviewed - No data to display  Imaging Review Dg Lumbar Spine Complete  12/19/2014   CLINICAL DATA:  54 year old female with lumbar spine pain after falling while coming down stairs  EXAM: LUMBAR SPINE - COMPLETE 4+ VIEW  COMPARISON:  Prior radiographs of the lumbar spine 03/20/2014  FINDINGS: Surgical changes of prior L5-S1 posterior lumbar interbody fusion with interbody graft. No evidence of hardware complication or change in alignment. No evidence of acute fracture or malalignment. Normal bony mineralization. Unremarkable visualized bowel gas pattern.  IMPRESSION: 1. No evidence of acute fracture, hardware complication or change in alignment. 2. Surgical changes of prior L5-S1 posterior lumbar interbody fusion with interbody  graft.   Electronically Signed   By: Malachy Moan M.D.   On: 12/19/2014 08:58   Dg Forearm Left  12/19/2014   CLINICAL DATA:  Fall on stairs with bilateral forearm hematoma. Initial encounter.  EXAM: LEFT FOREARM - 2 VIEW  COMPARISON:  None.  FINDINGS: Contusion in the posterior and lateral forearm. No opaque foreign body or fracture. No malalignment.  IMPRESSION: Soft tissue contusion without fracture.   Electronically Signed   By: Marnee Spring M.D.   On: 12/19/2014 08:54   Dg Forearm Right  12/19/2014   CLINICAL DATA:  54 year old female status post fall while coming down stairs  EXAM: RIGHT FOREARM - 2 VIEW  COMPARISON:  Concurrently obtained radiographs of the contralateral left forearm  FINDINGS: Focal soft tissue swelling over the dorsal aspect of the proximal forearm consistent with contusion. No evidence  of underlying fracture or malalignment. Normal bony mineralization.  IMPRESSION: Dorsal soft tissue contusion without evidence of underlying fracture.   Electronically Signed   By: Malachy Moan M.D.   On: 12/19/2014 08:56     MDM   Final diagnoses:  Contusion, back, unspecified laterality, initial encounter    Overall the patient is well-appearing, imaging shows no signs of fracture or change in her hardware, forearm x-rays of the left and right normal. We'll give pain medication, anticipate close follow-up, anti-inflammatories and muscle relaxants as well. The patient is in agreement. She has been given a copy of her x-ray results.  No neurologic signs or symptoms to the upper extremities despite the contusions and hematomas. No fractures of the forearms, no fractures of the spine, patient given a copy of her results as well as digital copy of her x-rays to share with her neurosurgeon who she will see this afternoon.  Meds given in ED:  Medications  HYDROmorphone (DILAUDID) injection 1 mg (not administered)    New Prescriptions   NAPROXEN (NAPROSYN) 500 MG TABLET     Take 1 tablet (500 mg total) by mouth 2 (two) times daily with a meal.   OXYCODONE-ACETAMINOPHEN (PERCOCET) 5-325 MG PER TABLET    Take 1 tablet by mouth every 4 (four) hours as needed.        Vida Roller, MD 12/19/14 6948  Vida Roller, MD 12/19/14 [id number]

## 2015-01-07 ENCOUNTER — Other Ambulatory Visit: Payer: Self-pay

## 2015-01-07 DIAGNOSIS — Z1231 Encounter for screening mammogram for malignant neoplasm of breast: Secondary | ICD-10-CM

## 2015-01-16 ENCOUNTER — Other Ambulatory Visit: Payer: Self-pay | Admitting: Interventional Cardiology

## 2015-01-23 ENCOUNTER — Ambulatory Visit: Payer: BLUE CROSS/BLUE SHIELD

## 2015-01-31 ENCOUNTER — Ambulatory Visit: Payer: BLUE CROSS/BLUE SHIELD

## 2015-07-16 ENCOUNTER — Other Ambulatory Visit: Payer: Self-pay | Admitting: Neurosurgery

## 2015-07-16 ENCOUNTER — Ambulatory Visit
Admission: RE | Admit: 2015-07-16 | Discharge: 2015-07-16 | Disposition: A | Discharge status: Home / Self Care | Payer: BLUE CROSS/BLUE SHIELD | Source: Ambulatory Visit | Attending: Neurosurgery | Admitting: Neurosurgery

## 2015-07-16 DIAGNOSIS — M47816 Spondylosis without myelopathy or radiculopathy, lumbar region: Secondary | ICD-10-CM

## 2015-10-23 DIAGNOSIS — S61012A Laceration without foreign body of left thumb without damage to nail, initial encounter: Secondary | ICD-10-CM | POA: Insufficient documentation

## 2015-10-23 DIAGNOSIS — L089 Local infection of the skin and subcutaneous tissue, unspecified: Secondary | ICD-10-CM | POA: Insufficient documentation

## 2015-12-27 ENCOUNTER — Other Ambulatory Visit: Payer: Self-pay

## 2015-12-27 DIAGNOSIS — Z1231 Encounter for screening mammogram for malignant neoplasm of breast: Secondary | ICD-10-CM

## 2016-02-04 ENCOUNTER — Ambulatory Visit
Admission: RE | Admit: 2016-02-04 | Discharge: 2016-02-04 | Disposition: A | Discharge status: Home / Self Care | Payer: 59 | Source: Ambulatory Visit

## 2016-02-04 DIAGNOSIS — Z1231 Encounter for screening mammogram for malignant neoplasm of breast: Secondary | ICD-10-CM

## 2016-02-21 ENCOUNTER — Other Ambulatory Visit: Payer: Self-pay

## 2016-02-21 MED ORDER — DILTIAZEM HCL ER COATED BEADS 180 MG PO CP24: 180.0000 mg | ORAL_CAPSULE | Freq: Every day | ORAL | Status: DC

## 2016-03-04 ENCOUNTER — Ambulatory Visit: Payer: BLUE CROSS/BLUE SHIELD | Admitting: Interventional Cardiology

## 2016-05-18 ENCOUNTER — Other Ambulatory Visit: Payer: Self-pay | Admitting: *Deleted

## 2016-05-18 ENCOUNTER — Other Ambulatory Visit: Payer: Self-pay | Admitting: Interventional Cardiology

## 2016-05-18 MED ORDER — DILTIAZEM HCL ER COATED BEADS 180 MG PO CP24: 180.0000 mg | ORAL_CAPSULE | Freq: Every day | ORAL | Status: DC

## 2016-05-27 ENCOUNTER — Encounter: Payer: Self-pay | Admitting: Interventional Cardiology

## 2016-05-27 ENCOUNTER — Ambulatory Visit (INDEPENDENT_AMBULATORY_CARE_PROVIDER_SITE_OTHER): Payer: 59 | Admitting: Interventional Cardiology

## 2016-05-27 VITALS — BP 122/80 | HR 56 | Ht 72.0 in | Wt 160.8 lb

## 2016-05-27 DIAGNOSIS — I4891 Unspecified atrial fibrillation: Secondary | ICD-10-CM

## 2016-05-27 DIAGNOSIS — Z13220 Encounter for screening for lipoid disorders: Secondary | ICD-10-CM

## 2016-05-27 DIAGNOSIS — I480 Paroxysmal atrial fibrillation: Secondary | ICD-10-CM | POA: Diagnosis not present

## 2016-05-27 DIAGNOSIS — I872 Venous insufficiency (chronic) (peripheral): Secondary | ICD-10-CM

## 2016-05-27 MED ORDER — DILTIAZEM HCL ER COATED BEADS 180 MG PO CP24: 180.0000 mg | ORAL_CAPSULE | Freq: Every day | ORAL | Status: DC

## 2016-05-27 NOTE — Progress Notes (Signed)
Cardiology Office Note   Date:  05/27/2016   ID:  Melissa Fritz, DOB 22-Nov-1960, MRN [id number]  PCP:  Allean Found, MD    No chief complaint on file.    Wt Readings from Last 3 Encounters:  05/27/16 160 lb 12.8 oz (72.938 kg)  11/28/14 159 lb (72.122 kg)  11/20/13 155 lb 12.8 oz (70.67 kg)       History of Present Illness: Melissa Fritz is a 55 y.o. female  who has had AFib in the past, and other arrhythmias when she was very young. She was hospitalized at a young age for arrhythmic issues. She had a failed ablation at age 60 at [facility], Dr. Cloyde Reams [phone]).  Sx have been under control. If she misses a medicine, she will have a few minutes of palpitations. No syncope or lightheadedness. SHe has a history of arrhythmia dating back to many years ago. Atrial Fibrillation F/U:  c/o Dizziness while getting up from sitting position.  Denies : Chest pain.  Leg edema.  Orthopnea.  Shortness of breath.  Syncope.    She had a spinal fusion in late 2015. 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 Walking and standing are better after the surgery. No arrhythmia during the perioperative period.  Rare palpitations. It feels like her heart flips in her chest but it resolved on its own. No blurry vision or lightheadedness.   Exercise has decreased since her back pain. She has not felt pressure in her chest with exertion . She has not had a prolonged episode of AFib. No bleeding problems.     Past Medical History  Diagnosis Date  . Family history of breast cancer   . Fibrocystic breast disease   . Arrhythmia     afib  . DDD (degenerative disc disease)   . History of colonoscopy     Past Surgical History  Procedure Laterality Date  . Knee surgery Right   . Rotator cuff repair    . Partial hysterectomy      one ovary remains  . Bowel resection      adhesions causing bloackage  . Colonoscopy  7/14     Current Outpatient  Prescriptions  Medication Sig Dispense Refill  . aspirin 81 MG tablet Take 81 mg by mouth daily.    Marland Kitchen CARTIA XT 180 MG 24 hr capsule TAKE ONE CAPSULE BY MOUTH DAILY 90 capsule 0  . Cyanocobalamin (VITAMIN B-12) 1000 MCG/15ML LIQD Take 1 mL by mouth every 21 ( twenty-one) days.    . Multiple Vitamins-Minerals (MULTIVITAMIN PO) Take 1 tablet by mouth daily.     Marland Kitchen oxyCODONE-acetaminophen (PERCOCET) 5-325 MG tablet Take 1-2 tablets by mouth every 8 (eight) hours as needed for moderate pain or severe pain.    . vitamin E 400 UNIT capsule Take 400 Units by mouth daily.     No current facility-administered medications for this visit.    Allergies:   Demerol    Social History:  The patient  reports that she has never smoked. She does not have any smokeless tobacco history on file. She reports that she drinks alcohol. She reports that she does not use illicit drugs.   Family History:  The patient's family history includes Atrial fibrillation in her brother; Breast cancer in her maternal grandmother and mother; CVA in her paternal grandfather; Glaucoma in her father; Heart disease in her brother and brother; Polymyalgia rheumatica in her mother and sister. There  is no history of Heart attack.    ROS:  Please see the history of present illness.   Otherwise, review of systems are positive for shoulder pain.   All other systems are reviewed and negative.    PHYSICAL EXAM: VS:  BP 122/80 mmHg  Pulse 56  Ht 6' (1.829 m)  Wt 160 lb 12.8 oz (72.938 kg)  BMI 21.80 kg/m2 , BMI Body mass index is 21.8 kg/(m^2). GEN: Well nourished, well developed, in no acute distress HEENT: normal Neck: no JVD, carotid bruits, or masses Cardiac: RRR; no murmurs, rubs, or gallops,no edema  Respiratory:  clear to auscultation bilaterally, normal work of breathing GI: soft, nontender, nondistended, + BS MS: no deformity or atrophy Skin: warm and dry, small are aof discoloration on right leg Neuro:  Strength and  sensation are intact Psych: euthymic mood, full affect   EKG:   The ekg ordered today demonstrates SB, no ST segment changes   Recent Labs: No results found for requested labs within last 365 days.   Lipid Panel No results found for: CHOL, TRIG, HDL, CHOLHDL, VLDL, LDLCALC, LDLDIRECT   Other studies Reviewed: Additional studies/ records that were reviewed today with results demonstrating: Normal LV function in 2012.   ASSESSMENT AND PLAN:  1. AFib: Refill Cartia XT Capsule Extended Release 24 Hour, 180 MG, 1 capsule, Orally, Once a day, 90 days, 90, Refills 3 Continue Aspirin EC Lo-Dose Tablet Delayed Release, 81 MG, 1 tablet, Orally, Once a day, 30 day(s), 30 Would minimize caffeine and alcohol.  She will continue to monitor sx and let us know.  She was considering seeing Dr. Johney Frame.  She would reconsider ablation attempt if she has palpitations or severe side effects to medicine.   2. Mitral and aortic regurgitation: trace to mild on 2012 echo. No need for SBE prophylaxis. 3.  Venous insufficiency: mild.  Some early prominent veins.  Some small areas of discoloration noted on right leg.  2+ right PT pulse. Rare pedal swelling. She is trying to get back into more walking these days. Lipids reviewed. High good cholesterol.  She will have this checked with Dr. Katrinka Blazing.  It has been 3 years.    Current medicines are reviewed at length with the patient today.  The patient concerns regarding her medicines were addressed.  The following changes have been made:  No change  Labs/ tests ordered today include:  Orders Placed This Encounter  Procedures  . EKG 12-Lead    Recommend 150 minutes/week of aerobic exercise Low fat, low carb, high fiber diet recommended  Disposition:   FU in 1 year   Signed, Lance Muss, MD  05/27/2016 9:25 AM    [facility] [address] [place], [place], [place]  [address] Phone: [phone]; Fax: [phone]

## 2016-05-27 NOTE — Patient Instructions (Signed)
**Note De-identified Yehuda Printup Obfuscation** Medication Instructions:  Same-no changes  Labwork: None  Testing/Procedures: None  Follow-Up: Your physician wants you to follow-up in: 1 year.You will receive a reminder letter in the mail two months in advance. If you don't receive a letter, please call our office to schedule the follow-up appointment.   Any Other Special Instructions Will Be Listed Below (If Applicable).     If you need a refill on your cardiac medications before your next appointment, please call your pharmacy.   

## 2016-10-14 ENCOUNTER — Telehealth: Payer: Self-pay | Admitting: Interventional Cardiology

## 2016-10-14 ENCOUNTER — Encounter: Payer: Self-pay | Admitting: Physician Assistant

## 2016-10-14 DIAGNOSIS — I4891 Unspecified atrial fibrillation: Secondary | ICD-10-CM

## 2016-10-14 NOTE — Telephone Encounter (Signed)
I spoke to patient.  She states she has torn rotator cuff in 4 places and is having shoulder repair on next Friday.  She states 3 weeks ago (after injury) she developed left lower jaw pain, hot flashes, and night sweats.  She states the jaw pain is dull ache, "like dental numbing wearing off" and relieved by pain medication she takes for her shoulder.  She states she thinks the pain is from shoulder injury or teeth grinding, but has cardiac history and would like to be checked out before surgery.  She denies chest pain or SOB at this time. She does c/o being tired, but states she has been taking pain medication for shoulder.  She states she has not been in A-Fib recently.  Patient states she is out of town now and will not be back until next Monday (10/19/17). I scheduled appt for pre-op reassurance on 12/12 with Tereso Newcomer, PA-C. I advised her if she developed chest pain or SOB to call 911 or go to ED. She voiced understanding and agreed with plan.

## 2016-10-14 NOTE — Telephone Encounter (Signed)
New Message:    Pt is concerned about a dull ache she have been having In her jaw  for the past  3 weeks. Pt also have a rotator cuff that is torn in 4 places. She asked her doctor if the jaw pain could be associated with that. He told her to call and talk to her Cardiologist about this pain. Pt says the pain started around the times she started having night sweats,and hot flashes.Please call to advise,

## 2016-10-14 NOTE — Telephone Encounter (Signed)
Since she has surgery scheduled, we may need to do a stress test. Can she walk on a treadmill? If so, may be good to schedule plain ETT (for a day after she sees me). If I decide she does not need an ETT at her appt, I can cancel it. Tereso Newcomer, PA-C   10/14/2016 4:43 PM

## 2016-10-15 NOTE — Telephone Encounter (Signed)
I spoke to patient this morning. She states she is doing "ok" today.  I advised her of S. Weaver's plan.  She states she can walk on treadmill.  She states she has another appt 12/14 @ 1600, otherwise anytime will be fine for study.    Order entered. Patient advised office will call with appt for GXT.  Pt reports she is only taking Cartia XT and Percocet at this point. She states she has stopped all other medications due to upcoming surgery.  Patient voiced understanding of instructions and agreed with plan.

## 2016-10-16 NOTE — Telephone Encounter (Signed)
GXT scheduled for 10/21/16

## 2016-10-20 ENCOUNTER — Encounter: Payer: Self-pay | Admitting: Physician Assistant

## 2016-10-20 ENCOUNTER — Ambulatory Visit (INDEPENDENT_AMBULATORY_CARE_PROVIDER_SITE_OTHER): Payer: 59 | Admitting: Physician Assistant

## 2016-10-20 VITALS — BP 122/60 | HR 70 | Ht 72.0 in | Wt 164.8 lb

## 2016-10-20 DIAGNOSIS — I480 Paroxysmal atrial fibrillation: Secondary | ICD-10-CM | POA: Diagnosis not present

## 2016-10-20 DIAGNOSIS — Z01810 Encounter for preprocedural cardiovascular examination: Secondary | ICD-10-CM

## 2016-10-20 DIAGNOSIS — R6884 Jaw pain: Secondary | ICD-10-CM | POA: Diagnosis not present

## 2016-10-20 NOTE — Progress Notes (Signed)
Cardiology Office Note:    Date:  10/20/2016   ID:  Melissa Fritz, DOB Sep 18, 1961, MRN [id number]  PCP:  Allean Found, MD  Cardiologist:  Dr. Everette Rank   Electrophysiologist:  N/a Orthopedic surgeon: Dr. Rennis Chris  Referring MD: Merri Brunette, MD   Chief Complaint  Patient presents with  . Jaw Pain; Surgical Clearance    History of Present Illness:    Melissa Fritz is a 55 y.o. female with a hx of paroxysmal atrial fibrillation. She was hospitalized at a young age for arrhythmias. She had a failed ablation at age 54 at [facility] with Dr. Cloyde Reams in 1991.  Last seen by Dr. Eldridge Dace 7/17.  CHADS2-VASc=1 (female).  She has been maintained on ASA only.    She needs shoulder surgery. She recently called in with complaints of jaw discomfort and was added on for further evaluation.  She has a torn rotator cuff on the left. For the past several weeks, she notes intermittent achiness in her left jaw. This does not seem to be related to exertion or activity. She has noted frequent hot flushes recently as well as night sweats. She has had these in the past and she relates them to perimenopausal symptoms. However, they have been worse recently. She denies chest discomfort. She has mild dyspnea with more moderate to extreme activities. This is largely unchanged. She has not been consistently exercising since spinal fusion surgery 2 years ago. Jaw discomfort may last 45 minutes to an hour. She has no other associated symptoms.   Prior CV studies that were reviewed today include:    Echo 11/12 EF 60-65, normal diastolic function, mildly thickened mitral valve with mild MR, trace AI, mild TR, normal atrial size  Past Medical History:  Diagnosis Date  . Arrhythmia    afib  . DDD (degenerative disc disease)   . Family history of breast cancer   . Fibrocystic breast disease   . History of colonoscopy     Past Surgical History:  Procedure Laterality Date  . BOWEL RESECTION     adhesions causing bloackage  . COLONOSCOPY  7/14  . KNEE SURGERY Right   . PARTIAL HYSTERECTOMY     one ovary remains  . ROTATOR CUFF REPAIR      Current Medications: Current Meds  Medication Sig  . Cyanocobalamin (VITAMIN B-12) 1000 MCG/15ML LIQD Take 1 mL by mouth every 21 ( twenty-one) days.  Marland Kitchen diltiazem (CARTIA XT) 180 MG 24 hr capsule Take 1 capsule (180 mg total) by mouth daily.  Marland Kitchen latanoprost (XALATAN) 0.005 % ophthalmic solution Place 1 drop into the right eye at bedtime.   Marland Kitchen oxyCODONE-acetaminophen (PERCOCET) 5-325 MG tablet Take 1-2 tablets by mouth every 8 (eight) hours as needed for moderate pain or severe pain.  . vitamin E 400 UNIT capsule Take 400 Units by mouth daily.     Allergies:   Demerol [meperidine]   Social History   Social History  . Marital status: Married    Spouse name: N/A  . Number of children: N/A  . Years of education: N/A   Social History Main Topics  . Smoking status: Never Smoker  . Smokeless tobacco: Never Used  . Alcohol use Yes  . Drug use: No  . Sexual activity: Yes    Birth control/ protection: None   Other Topics Concern  . None   Social History Narrative  . None     Family History:  The patient's family history includes Atrial  fibrillation in her brother; Breast cancer in her maternal grandmother and mother; CVA in her paternal grandfather; Glaucoma in her father; Heart disease in her brother and brother; Polymyalgia rheumatica in her mother and sister.   ROS:   Please see the history of present illness.    Review of Systems  Constitution: Positive for diaphoresis.  Cardiovascular: Positive for irregular heartbeat.   All other systems reviewed and are negative.   EKGs/Labs/Other Test Reviewed:    EKG:  EKG is  ordered today.  The ekg ordered today demonstrates NSR, HR 70, normal axis, QTc 427 ms  Recent Labs: No results found for requested labs within last 8760 hours.   Recent Lipid Panel No results found for: CHOL,  TRIG, HDL, CHOLHDL, VLDL, LDLCALC, LDLDIRECT   Physical Exam:    VS:  BP 122/60   Pulse 70   Ht 6' (1.829 m)   Wt 164 lb 12.8 oz (74.8 kg)   BMI 22.35 kg/m     Wt Readings from Last 3 Encounters:  10/20/16 164 lb 12.8 oz (74.8 kg)  05/27/16 160 lb 12.8 oz (72.9 kg)  11/28/14 159 lb (72.1 kg)     Physical Exam  Constitutional: She is oriented to person, place, and time. She appears well-developed and well-nourished. No distress.  HENT:  Head: Normocephalic and atraumatic.  Eyes: No scleral icterus.  Neck: Carotid bruit is not present.  Cardiovascular: Normal rate, regular rhythm and normal heart sounds.   No murmur heard. Pulmonary/Chest: Effort normal. She has no wheezes. She has no rales.  Abdominal: Soft. There is no tenderness.  Musculoskeletal: She exhibits no edema.  Lymphadenopathy:    She has no cervical adenopathy.  Neurological: She is alert and oriented to person, place, and time.  Skin: Skin is warm and dry.  Psychiatric: She has a normal mood and affect.    ASSESSMENT:    1. Jaw pain   2. PAF (paroxysmal atrial fibrillation) (HCC)   3. Pre-operative cardiovascular examination    PLAN:    In order of problems listed above:  1. Jaw pain - Her symptoms are somewhat atypical for ischemia. Question if they're related to her shoulder. However, this seems to be an atypical symptom of rotator cuff injury. As she does need general anesthesia for her surgery, I feel it is best to proceed with stress testing for risk stratification. Proceed with exercise treadmill test tomorrow. If this is low risk, she may proceed with surgery. If her jaw symptoms do not resolve with her shoulder surgery, she will need follow-up with primary care to evaluate other causes (? Trigeminal neuralgia).  2. Paroxysmal AF - She has infrequent episodes of atrial fibrillation. Risks for thromboembolism are low and she is maintained on aspirin only.  3. Surgical clearance - If ETT is low  risk, she will be able to proceed with her surgery at acceptable risk.   Medication Adjustments/Labs and Tests Ordered: Current medicines are reviewed at length with the patient today.  Concerns regarding medicines are outlined above.  Medication changes, Labs and Tests ordered today are outlined in the Patient Instructions noted below. Patient Instructions  Medication Instructions:  Your physician recommends that you continue on your current medications as directed. Please refer to the Current Medication list given to you today.  Labwork: NONE  Testing/Procedures: KEEP YOUR STRESS TEST APPT TOMORROW; MAKE SURE TO HOLD DILTIAZEM TOMORROW MORNING FOR THE STRESS TEST; YOU CAN TAKE IT AFTER THE TEST IS DONE PER Tuan Tippin, PAC  Follow-Up: WE WILL SEND OUT A REMINDER LETTER A COUPLE OF MONTHS EARLIER FOR YOU TO FOLLOW UP WITH DR. VARANASI SOMETIME AROUND 05/2017  Any Other Special Instructions Will Be Listed Below (If Applicable).  If you need a refill on your cardiac medications before your next appointment, please call your pharmacy.   Signed, Tereso Newcomer, PA-C  10/20/2016 5:09 PM    [facility] [address] [place], [place], [place]  [address] Phone: [phone]; Fax: [phone]

## 2016-10-20 NOTE — Patient Instructions (Addendum)
Medication Instructions:  Your physician recommends that you continue on your current medications as directed. Please refer to the Current Medication list given to you today.  Labwork: NONE  Testing/Procedures: KEEP YOUR STRESS TEST APPT TOMORROW; MAKE SURE TO HOLD DILTIAZEM TOMORROW MORNING FOR THE STRESS TEST; YOU CAN TAKE IT AFTER THE TEST IS DONE PER SCOTT WEAVER, PAC   Follow-Up: WE WILL SEND OUT A REMINDER LETTER A COUPLE OF MONTHS EARLIER FOR YOU TO FOLLOW UP WITH DR. VARANASI SOMETIME AROUND 05/2017  Any Other Special Instructions Will Be Listed Below (If Applicable).  If you need a refill on your cardiac medications before your next appointment, please call your pharmacy.

## 2016-10-21 ENCOUNTER — Other Ambulatory Visit: Payer: Self-pay | Admitting: *Deleted

## 2016-10-21 ENCOUNTER — Ambulatory Visit (INDEPENDENT_AMBULATORY_CARE_PROVIDER_SITE_OTHER): Payer: 59

## 2016-10-21 ENCOUNTER — Telehealth (HOSPITAL_COMMUNITY): Payer: Self-pay | Admitting: *Deleted

## 2016-10-21 DIAGNOSIS — R9439 Abnormal result of other cardiovascular function study: Secondary | ICD-10-CM

## 2016-10-21 DIAGNOSIS — I4891 Unspecified atrial fibrillation: Secondary | ICD-10-CM

## 2016-10-21 LAB — EXERCISE TOLERANCE TEST
Estimated workload: 7.7 METS
Exercise duration (min): 6 min
Exercise duration (sec): 30 s
MPHR: 166 {beats}/min
Peak HR: 176 {beats}/min
Percent HR: 106 %
RPE: 15
Rest HR: 77 {beats}/min

## 2016-10-21 NOTE — Telephone Encounter (Signed)
Patient given detailed instructions per Myocardial Perfusion Study Information Sheet for the test on 10/22/16 at 10:15. Patient notified to arrive 15 minutes early and that it is imperative to arrive on time for appointment to keep from having the test rescheduled.  If you need to cancel or reschedule your appointment, please call the office within 24 hours of your appointment. Failure to do so may result in a cancellation of your appointment, and a $50 no show fee. Patient verbalized understanding.Daneil Dolin

## 2016-10-22 ENCOUNTER — Ambulatory Visit (HOSPITAL_COMMUNITY): Payer: 59 | Attending: Cardiology

## 2016-10-22 ENCOUNTER — Encounter: Payer: Self-pay | Admitting: Physician Assistant

## 2016-10-22 DIAGNOSIS — R0602 Shortness of breath: Secondary | ICD-10-CM | POA: Insufficient documentation

## 2016-10-22 DIAGNOSIS — R9439 Abnormal result of other cardiovascular function study: Secondary | ICD-10-CM

## 2016-10-22 DIAGNOSIS — R0600 Dyspnea, unspecified: Secondary | ICD-10-CM | POA: Diagnosis present

## 2016-10-22 LAB — MYOCARDIAL PERFUSION IMAGING
Estimated workload: 8.5 METS
Exercise duration (min): 7 min
Exercise duration (sec): 1 s
LV dias vol: 111 mL (ref 46–106)
LV sys vol: 42 mL
MPHR: 166 {beats}/min
Peak HR: 166 {beats}/min
Percent HR: 103 %
Percent of predicted max HR: 100 %
RATE: 0.31
RPE: 18
Rest HR: 71 {beats}/min
SDS: 0
SRS: 1
SSS: 1
Stage 1 DBP: 85 mmHg
Stage 1 Grade: 0 %
Stage 1 HR: 87 {beats}/min
Stage 1 SBP: 137 mmHg
Stage 1 Speed: 0 mph
Stage 2 Grade: 0.2 %
Stage 2 HR: 87 {beats}/min
Stage 2 Speed: 0.1 mph
Stage 3 DBP: 86 mmHg
Stage 3 Grade: 10 %
Stage 3 HR: 108 {beats}/min
Stage 3 SBP: 150 mmHg
Stage 3 Speed: 1.7 mph
Stage 4 DBP: 83 mmHg
Stage 4 Grade: 12 %
Stage 4 HR: 142 {beats}/min
Stage 4 SBP: 172 mmHg
Stage 4 Speed: 2.5 mph
Stage 5 Grade: 14 %
Stage 5 HR: 166 {beats}/min
Stage 5 Speed: 3.4 mph
Stage 6 DBP: 81 mmHg
Stage 6 Grade: 0 %
Stage 6 HR: 123 {beats}/min
Stage 6 SBP: 177 mmHg
Stage 6 Speed: 0 mph
Stage 7 DBP: 81 mmHg
Stage 7 Grade: 0 %
Stage 7 HR: 93 {beats}/min
Stage 7 SBP: 139 mmHg
Stage 7 Speed: 0 mph
TID: 1.03

## 2016-10-22 MED ORDER — TECHNETIUM TC 99M TETROFOSMIN IV KIT
30.6000 | PACK | Freq: Once | INTRAVENOUS | Status: AC | PRN
  Administered 2016-10-22: 30.6 via INTRAVENOUS
  Filled 2016-10-22: qty 31

## 2016-10-22 MED ORDER — TECHNETIUM TC 99M TETROFOSMIN IV KIT
10.2000 | PACK | Freq: Once | INTRAVENOUS | Status: AC | PRN
  Administered 2016-10-22: 10.2 via INTRAVENOUS
  Filled 2016-10-22: qty 11

## 2016-10-23 ENCOUNTER — Encounter (HOSPITAL_COMMUNITY): Payer: 59

## 2016-11-09 HISTORY — PX: BREAST BIOPSY: SHX20

## 2016-11-16 DIAGNOSIS — E538 Deficiency of other specified B group vitamins: Secondary | ICD-10-CM | POA: Diagnosis not present

## 2016-11-18 DIAGNOSIS — H40053 Ocular hypertension, bilateral: Secondary | ICD-10-CM | POA: Diagnosis not present

## 2016-12-02 DIAGNOSIS — E538 Deficiency of other specified B group vitamins: Secondary | ICD-10-CM | POA: Diagnosis not present

## 2016-12-03 DIAGNOSIS — M25512 Pain in left shoulder: Secondary | ICD-10-CM | POA: Diagnosis not present

## 2016-12-08 DIAGNOSIS — M25512 Pain in left shoulder: Secondary | ICD-10-CM | POA: Diagnosis not present

## 2016-12-10 DIAGNOSIS — M25512 Pain in left shoulder: Secondary | ICD-10-CM | POA: Diagnosis not present

## 2016-12-15 DIAGNOSIS — M25512 Pain in left shoulder: Secondary | ICD-10-CM | POA: Diagnosis not present

## 2016-12-17 DIAGNOSIS — M25512 Pain in left shoulder: Secondary | ICD-10-CM | POA: Diagnosis not present

## 2016-12-22 DIAGNOSIS — M25512 Pain in left shoulder: Secondary | ICD-10-CM | POA: Diagnosis not present

## 2016-12-24 DIAGNOSIS — M25512 Pain in left shoulder: Secondary | ICD-10-CM | POA: Diagnosis not present

## 2016-12-25 DIAGNOSIS — E538 Deficiency of other specified B group vitamins: Secondary | ICD-10-CM | POA: Diagnosis not present

## 2016-12-28 DIAGNOSIS — M25512 Pain in left shoulder: Secondary | ICD-10-CM | POA: Diagnosis not present

## 2016-12-29 DIAGNOSIS — M4800 Spinal stenosis, site unspecified: Secondary | ICD-10-CM | POA: Diagnosis not present

## 2016-12-29 DIAGNOSIS — M47816 Spondylosis without myelopathy or radiculopathy, lumbar region: Secondary | ICD-10-CM | POA: Diagnosis not present

## 2016-12-30 DIAGNOSIS — H40053 Ocular hypertension, bilateral: Secondary | ICD-10-CM | POA: Diagnosis not present

## 2017-01-06 DIAGNOSIS — M25512 Pain in left shoulder: Secondary | ICD-10-CM | POA: Diagnosis not present

## 2017-01-08 DIAGNOSIS — E538 Deficiency of other specified B group vitamins: Secondary | ICD-10-CM | POA: Diagnosis not present

## 2017-01-08 DIAGNOSIS — M25512 Pain in left shoulder: Secondary | ICD-10-CM | POA: Diagnosis not present

## 2017-01-11 DIAGNOSIS — M25512 Pain in left shoulder: Secondary | ICD-10-CM | POA: Diagnosis not present

## 2017-01-13 DIAGNOSIS — M25512 Pain in left shoulder: Secondary | ICD-10-CM | POA: Diagnosis not present

## 2017-01-15 DIAGNOSIS — M25512 Pain in left shoulder: Secondary | ICD-10-CM | POA: Diagnosis not present

## 2017-01-20 DIAGNOSIS — M25512 Pain in left shoulder: Secondary | ICD-10-CM | POA: Diagnosis not present

## 2017-01-20 DIAGNOSIS — M7502 Adhesive capsulitis of left shoulder: Secondary | ICD-10-CM | POA: Diagnosis not present

## 2017-01-22 DIAGNOSIS — E538 Deficiency of other specified B group vitamins: Secondary | ICD-10-CM | POA: Diagnosis not present

## 2017-01-22 DIAGNOSIS — M25512 Pain in left shoulder: Secondary | ICD-10-CM | POA: Diagnosis not present

## 2017-01-25 DIAGNOSIS — M25512 Pain in left shoulder: Secondary | ICD-10-CM | POA: Diagnosis not present

## 2017-02-11 DIAGNOSIS — M25512 Pain in left shoulder: Secondary | ICD-10-CM | POA: Diagnosis not present

## 2017-02-12 DIAGNOSIS — E538 Deficiency of other specified B group vitamins: Secondary | ICD-10-CM | POA: Diagnosis not present

## 2017-02-16 DIAGNOSIS — M25512 Pain in left shoulder: Secondary | ICD-10-CM | POA: Diagnosis not present

## 2017-02-17 DIAGNOSIS — M7502 Adhesive capsulitis of left shoulder: Secondary | ICD-10-CM | POA: Diagnosis not present

## 2017-02-17 DIAGNOSIS — Z4789 Encounter for other orthopedic aftercare: Secondary | ICD-10-CM | POA: Diagnosis not present

## 2017-02-23 DIAGNOSIS — E538 Deficiency of other specified B group vitamins: Secondary | ICD-10-CM | POA: Diagnosis not present

## 2017-03-10 DIAGNOSIS — E538 Deficiency of other specified B group vitamins: Secondary | ICD-10-CM | POA: Diagnosis not present

## 2017-03-16 ENCOUNTER — Other Ambulatory Visit: Payer: Self-pay | Admitting: Interventional Cardiology

## 2017-03-16 DIAGNOSIS — M7502 Adhesive capsulitis of left shoulder: Secondary | ICD-10-CM | POA: Diagnosis not present

## 2017-03-18 DIAGNOSIS — M7502 Adhesive capsulitis of left shoulder: Secondary | ICD-10-CM | POA: Diagnosis not present

## 2017-03-18 DIAGNOSIS — Z4789 Encounter for other orthopedic aftercare: Secondary | ICD-10-CM | POA: Diagnosis not present

## 2017-03-22 DIAGNOSIS — E538 Deficiency of other specified B group vitamins: Secondary | ICD-10-CM | POA: Diagnosis not present

## 2017-04-12 ENCOUNTER — Telehealth: Payer: Self-pay | Admitting: Interventional Cardiology

## 2017-04-12 DIAGNOSIS — M25512 Pain in left shoulder: Secondary | ICD-10-CM | POA: Diagnosis not present

## 2017-04-12 NOTE — Telephone Encounter (Signed)
OV 10/2016: stress test completed for pre op.  See result note.   Due for f/u 05/2017.  Routed to MD to verify.

## 2017-04-12 NOTE — Telephone Encounter (Signed)
New message    Request for surgical clearance:  1. What type of surgery is being performed? Shoulder    2. When is this surgery scheduled? 6.14.2018   3. Are there any medications that need to be held prior to surgery and how long?   patient states she does not take her baby asa   4. Name of physician performing surgery? Dr. Karlyn Agee   5. What is your office phone and fax number? Fax [phone]   Dx of afib - is patient safe to undergo surgery

## 2017-04-12 NOTE — Telephone Encounter (Signed)
No further cardiac testing needed before surgery.

## 2017-04-13 DIAGNOSIS — M47816 Spondylosis without myelopathy or radiculopathy, lumbar region: Secondary | ICD-10-CM | POA: Diagnosis not present

## 2017-04-13 DIAGNOSIS — R5383 Other fatigue: Secondary | ICD-10-CM | POA: Diagnosis not present

## 2017-04-13 DIAGNOSIS — M4800 Spinal stenosis, site unspecified: Secondary | ICD-10-CM | POA: Diagnosis not present

## 2017-04-13 DIAGNOSIS — M2550 Pain in unspecified joint: Secondary | ICD-10-CM | POA: Diagnosis not present

## 2017-04-13 DIAGNOSIS — E538 Deficiency of other specified B group vitamins: Secondary | ICD-10-CM | POA: Diagnosis not present

## 2017-04-13 NOTE — Telephone Encounter (Signed)
Clearance faxed to Dr. Barnie Mort office. Confirmation received that fax went through.

## 2017-04-19 DIAGNOSIS — M75122 Complete rotator cuff tear or rupture of left shoulder, not specified as traumatic: Secondary | ICD-10-CM | POA: Insufficient documentation

## 2017-04-21 DIAGNOSIS — H40053 Ocular hypertension, bilateral: Secondary | ICD-10-CM | POA: Diagnosis not present

## 2017-04-22 DIAGNOSIS — M75122 Complete rotator cuff tear or rupture of left shoulder, not specified as traumatic: Secondary | ICD-10-CM | POA: Diagnosis not present

## 2017-04-22 DIAGNOSIS — G8918 Other acute postprocedural pain: Secondary | ICD-10-CM | POA: Diagnosis not present

## 2017-04-22 DIAGNOSIS — M7502 Adhesive capsulitis of left shoulder: Secondary | ICD-10-CM | POA: Diagnosis not present

## 2017-04-22 DIAGNOSIS — M7542 Impingement syndrome of left shoulder: Secondary | ICD-10-CM | POA: Diagnosis not present

## 2017-04-27 DIAGNOSIS — E538 Deficiency of other specified B group vitamins: Secondary | ICD-10-CM | POA: Diagnosis not present

## 2017-04-30 DIAGNOSIS — M25512 Pain in left shoulder: Secondary | ICD-10-CM | POA: Diagnosis not present

## 2017-04-30 DIAGNOSIS — R6889 Other general symptoms and signs: Secondary | ICD-10-CM | POA: Diagnosis not present

## 2017-05-17 DIAGNOSIS — E538 Deficiency of other specified B group vitamins: Secondary | ICD-10-CM | POA: Diagnosis not present

## 2017-05-18 DIAGNOSIS — M25512 Pain in left shoulder: Secondary | ICD-10-CM | POA: Diagnosis not present

## 2017-05-18 DIAGNOSIS — R6889 Other general symptoms and signs: Secondary | ICD-10-CM | POA: Diagnosis not present

## 2017-05-25 DIAGNOSIS — R6889 Other general symptoms and signs: Secondary | ICD-10-CM | POA: Diagnosis not present

## 2017-05-25 DIAGNOSIS — M25512 Pain in left shoulder: Secondary | ICD-10-CM | POA: Diagnosis not present

## 2017-06-01 ENCOUNTER — Encounter: Payer: Self-pay | Admitting: *Deleted

## 2017-06-01 DIAGNOSIS — R6889 Other general symptoms and signs: Secondary | ICD-10-CM | POA: Diagnosis not present

## 2017-06-01 DIAGNOSIS — E538 Deficiency of other specified B group vitamins: Secondary | ICD-10-CM | POA: Diagnosis not present

## 2017-06-01 DIAGNOSIS — M25512 Pain in left shoulder: Secondary | ICD-10-CM | POA: Diagnosis not present

## 2017-06-08 DIAGNOSIS — R6889 Other general symptoms and signs: Secondary | ICD-10-CM | POA: Diagnosis not present

## 2017-06-08 DIAGNOSIS — M25512 Pain in left shoulder: Secondary | ICD-10-CM | POA: Diagnosis not present

## 2017-06-10 ENCOUNTER — Other Ambulatory Visit: Payer: Self-pay | Admitting: Physician Assistant

## 2017-06-10 ENCOUNTER — Other Ambulatory Visit: Payer: Self-pay | Admitting: Interventional Cardiology

## 2017-06-10 MED ORDER — DILTIAZEM HCL ER COATED BEADS 180 MG PO CP24: 180.0000 mg | ORAL_CAPSULE | Freq: Every day | ORAL | 0 refills | Status: DC

## 2017-06-15 DIAGNOSIS — E538 Deficiency of other specified B group vitamins: Secondary | ICD-10-CM | POA: Diagnosis not present

## 2017-06-16 ENCOUNTER — Ambulatory Visit (INDEPENDENT_AMBULATORY_CARE_PROVIDER_SITE_OTHER): Payer: Self-pay | Admitting: *Deleted

## 2017-06-16 DIAGNOSIS — I781 Nevus, non-neoplastic: Secondary | ICD-10-CM

## 2017-06-16 NOTE — Progress Notes (Signed)
X=.3% Sotradecol administered with a 27g butterfly.  Patient received a total of 6cc.  Had Dr. Arbie Cookey come in to consult. Injected all areas of concern Easy access. Tol well. Four by 4's applied over both vv's for increased compression as well as 4 inch ace wraps. Follow prn.   Compression stockings applied: Yes.

## 2017-06-21 ENCOUNTER — Encounter: Payer: Self-pay | Admitting: *Deleted

## 2017-06-21 DIAGNOSIS — N951 Menopausal and female climacteric states: Secondary | ICD-10-CM | POA: Diagnosis not present

## 2017-06-23 DIAGNOSIS — R768 Other specified abnormal immunological findings in serum: Secondary | ICD-10-CM | POA: Diagnosis not present

## 2017-06-23 DIAGNOSIS — M2550 Pain in unspecified joint: Secondary | ICD-10-CM | POA: Diagnosis not present

## 2017-06-23 DIAGNOSIS — M150 Primary generalized (osteo)arthritis: Secondary | ICD-10-CM | POA: Diagnosis not present

## 2017-07-13 ENCOUNTER — Other Ambulatory Visit: Payer: Self-pay | Admitting: Family Medicine

## 2017-07-13 DIAGNOSIS — Z1231 Encounter for screening mammogram for malignant neoplasm of breast: Secondary | ICD-10-CM

## 2017-07-14 DIAGNOSIS — M2550 Pain in unspecified joint: Secondary | ICD-10-CM | POA: Diagnosis not present

## 2017-07-14 DIAGNOSIS — M4316 Spondylolisthesis, lumbar region: Secondary | ICD-10-CM | POA: Diagnosis not present

## 2017-07-14 DIAGNOSIS — M47812 Spondylosis without myelopathy or radiculopathy, cervical region: Secondary | ICD-10-CM | POA: Diagnosis not present

## 2017-07-14 DIAGNOSIS — M150 Primary generalized (osteo)arthritis: Secondary | ICD-10-CM | POA: Diagnosis not present

## 2017-07-14 DIAGNOSIS — R768 Other specified abnormal immunological findings in serum: Secondary | ICD-10-CM | POA: Diagnosis not present

## 2017-07-15 DIAGNOSIS — E538 Deficiency of other specified B group vitamins: Secondary | ICD-10-CM | POA: Diagnosis not present

## 2017-08-03 DIAGNOSIS — E538 Deficiency of other specified B group vitamins: Secondary | ICD-10-CM | POA: Diagnosis not present

## 2017-08-09 DIAGNOSIS — Z01411 Encounter for gynecological examination (general) (routine) with abnormal findings: Secondary | ICD-10-CM | POA: Diagnosis not present

## 2017-08-09 DIAGNOSIS — N951 Menopausal and female climacteric states: Secondary | ICD-10-CM | POA: Diagnosis not present

## 2017-08-10 ENCOUNTER — Ambulatory Visit
Admission: RE | Admit: 2017-08-10 | Discharge: 2017-08-10 | Disposition: A | Discharge status: Home / Self Care | Payer: 59 | Source: Ambulatory Visit | Attending: Family Medicine | Admitting: Family Medicine

## 2017-08-10 DIAGNOSIS — Z1231 Encounter for screening mammogram for malignant neoplasm of breast: Secondary | ICD-10-CM | POA: Diagnosis not present

## 2017-08-11 ENCOUNTER — Other Ambulatory Visit: Payer: Self-pay | Admitting: Family Medicine

## 2017-08-11 DIAGNOSIS — R928 Other abnormal and inconclusive findings on diagnostic imaging of breast: Secondary | ICD-10-CM

## 2017-08-13 ENCOUNTER — Ambulatory Visit
Admission: RE | Admit: 2017-08-13 | Discharge: 2017-08-13 | Disposition: A | Discharge status: Home / Self Care | Payer: 59 | Source: Ambulatory Visit | Attending: Family Medicine | Admitting: Family Medicine

## 2017-08-13 ENCOUNTER — Other Ambulatory Visit: Payer: Self-pay | Admitting: Family Medicine

## 2017-08-13 DIAGNOSIS — R928 Other abnormal and inconclusive findings on diagnostic imaging of breast: Secondary | ICD-10-CM

## 2017-08-13 DIAGNOSIS — R921 Mammographic calcification found on diagnostic imaging of breast: Secondary | ICD-10-CM | POA: Diagnosis not present

## 2017-08-13 DIAGNOSIS — N6002 Solitary cyst of left breast: Secondary | ICD-10-CM | POA: Diagnosis not present

## 2017-08-16 ENCOUNTER — Ambulatory Visit
Admission: RE | Admit: 2017-08-16 | Discharge: 2017-08-16 | Disposition: A | Discharge status: Home / Self Care | Payer: 59 | Source: Ambulatory Visit | Attending: Family Medicine | Admitting: Family Medicine

## 2017-08-16 ENCOUNTER — Other Ambulatory Visit: Payer: Self-pay | Admitting: Family Medicine

## 2017-08-16 ENCOUNTER — Ambulatory Visit: Payer: 59 | Admitting: Interventional Cardiology

## 2017-08-16 DIAGNOSIS — R921 Mammographic calcification found on diagnostic imaging of breast: Secondary | ICD-10-CM | POA: Diagnosis not present

## 2017-08-16 DIAGNOSIS — R928 Other abnormal and inconclusive findings on diagnostic imaging of breast: Secondary | ICD-10-CM

## 2017-08-16 DIAGNOSIS — N6012 Diffuse cystic mastopathy of left breast: Secondary | ICD-10-CM | POA: Diagnosis not present

## 2017-08-17 DIAGNOSIS — E538 Deficiency of other specified B group vitamins: Secondary | ICD-10-CM | POA: Diagnosis not present

## 2017-08-17 DIAGNOSIS — Z23 Encounter for immunization: Secondary | ICD-10-CM | POA: Diagnosis not present

## 2017-08-18 NOTE — Progress Notes (Signed)
Cardiology Office Note   Date:  08/19/2017   ID:  Melissa Fritz, DOB 04/07/1961, MRN [id number]  PCP:  Merri Brunette, MD    No chief complaint on file. Arrhythmia   Wt Readings from Last 3 Encounters:  08/19/17 157 lb 3.2 oz (71.3 kg)  10/22/16 164 lb (74.4 kg)  10/20/16 164 lb 12.8 oz (74.8 kg)       History of Present Illness: Melissa Fritz is a 56 y.o. female  who has had AFib in the past, and other arrhythmias when she was very young. She was hospitalized at a young age for arrhythmic issues. She had a failed ablation at age 89 at [facility], Dr. Cloyde Reams [phone]).    She has had several surgeries  Since last year without cardiac issues.    Denies : Chest pain. Dizziness. Leg edema. Nitroglycerin use. Orthopnea. Palpitations. Paroxysmal nocturnal dyspnea. Shortness of breath. Syncope.   She walks regularly for activity.  No cardiac sx with that activity.  She had some vein injections with [facility].   She notes that she has palpitations if she misses her diltiazem.     Past Medical History:  Diagnosis Date  . Arrhythmia    afib  . DDD (degenerative disc disease)   . Family history of breast cancer   . Fibrocystic breast disease   . History of colonoscopy   . History of nuclear stress test    Myoview 12/17:  EF 62, normal perfusion; Low Risk    Past Surgical History:  Procedure Laterality Date  . BOWEL RESECTION     adhesions causing bloackage  . COLONOSCOPY  7/14  . KNEE SURGERY Right   . PARTIAL HYSTERECTOMY     one ovary remains  . ROTATOR CUFF REPAIR       Current Outpatient Prescriptions  Medication Sig Dispense Refill  . aspirin 81 MG tablet Take 81 mg by mouth daily. STOPPED TAKING UNTIL AFTER SURGERY AS OF 10/12/16    . Black Cohosh 540 MG CAPS Take 1 tablet by mouth daily.    . brimonidine (ALPHAGAN) 0.2 % ophthalmic solution Place 1 drop into both eyes 2 (two) times daily.    . Cyanocobalamin (VITAMIN B-12) 1000 MCG/15ML LIQD Take 1 mL by  mouth every 21 ( twenty-one) days.    Marland Kitchen diltiazem (CARTIA XT) 180 MG 24 hr capsule Take 1 capsule (180 mg total) by mouth daily. 90 capsule 0  . docusate sodium (COLACE) 100 MG capsule Take 1 capsule by mouth 3 (three) times daily.    . Multiple Vitamins-Minerals (MULTIVITAMIN PO) Take 1 tablet by mouth daily. STOPPED TAKING UNTIL AFTER SURGERY AS 10/12/16    . naproxen (NAPROSYN) 500 MG tablet Take 500 mg by mouth as needed.    Marland Kitchen oxyCODONE-acetaminophen (PERCOCET) 5-325 MG tablet Take 1-2 tablets by mouth every 8 (eight) hours as needed for moderate pain or severe pain.    Marland Kitchen sertraline (ZOLOFT) 50 MG tablet Take 50 mg by mouth daily.    . vitamin E 400 UNIT capsule Take 400 Units by mouth daily.     No current facility-administered medications for this visit.     Allergies:   Demerol [meperidine]    Social History:  The patient  reports that she has never smoked. She has never used smokeless tobacco. She reports that she drinks alcohol. She reports that she does not use drugs.   Family History:  The patient's family history includes Atrial fibrillation in her brother;  Breast cancer in her maternal grandmother and mother; CVA in her paternal grandfather; Glaucoma in her father; Heart disease in her brother and brother; Polymyalgia rheumatica in her mother and sister.    ROS:  Please see the history of present illness.   Otherwise, review of systems are positive for rare palpitations as noted above.   All other systems are reviewed and negative.    PHYSICAL EXAM: VS:  BP 122/84   Pulse 64   Ht 6' (1.829 m)   Wt 157 lb 3.2 oz (71.3 kg)   SpO2 99%   BMI 21.32 kg/m  , BMI Body mass index is 21.32 kg/m. GEN: Well nourished, well developed, in no acute distress  HEENT: normal  Neck: no JVD, carotid bruits, or masses Cardiac: RRR; no murmurs, rubs, or gallops,no edema  Respiratory:  clear to auscultation bilaterally, normal work of breathing GI: soft, nontender, nondistended, + BS MS:  no deformity or atrophy  Skin: warm and dry, no rash Neuro:  Strength and sensation are intact Psych: euthymic mood, full affect   EKG:   The ekg ordered today demonstrates *NSR no ST segment changes   Recent Labs: No results found for requested labs within last 8760 hours.   Lipid Panel No results found for: CHOL, TRIG, HDL, CHOLHDL, VLDL, LDLCALC, LDLDIRECT   Other studies Reviewed: Additional studies/ records that were reviewed today with results demonstrating: labs from PMD show normal renal function and thyroid fnction.   ASSESSMENT AND PLAN:  1. AFib: COntinue Cardizem.  Minimize caffeine and alcohol.  She would consider seeing EP if sx get worse.    2. Mitral regurg/AI: trivial in 2012.  No need for SBE prophylaxis.  3. Venous insufficiency: Follwed by [facility].    Current medicines are reviewed at length with the patient today.  The patient concerns regarding her medicines were addressed.  The following changes have been made:  No change  Labs/ tests ordered today include:  No orders of the defined types were placed in this encounter.   Recommend 150 minutes/week of aerobic exercise Low fat, low carb, high fiber diet recommended  Disposition:   FU in 1 year   Signed, Lance Muss, MD  08/19/2017 12:15 PM    [facility] [address] [place], [place], [place]  [address] Phone: [phone]; Fax: [phone]

## 2017-08-19 ENCOUNTER — Encounter: Payer: Self-pay | Admitting: Interventional Cardiology

## 2017-08-19 ENCOUNTER — Ambulatory Visit (INDEPENDENT_AMBULATORY_CARE_PROVIDER_SITE_OTHER): Payer: 59 | Admitting: Interventional Cardiology

## 2017-08-19 VITALS — BP 122/84 | HR 64 | Ht 72.0 in | Wt 157.2 lb

## 2017-08-19 DIAGNOSIS — I480 Paroxysmal atrial fibrillation: Secondary | ICD-10-CM

## 2017-08-19 MED ORDER — DILTIAZEM HCL ER COATED BEADS 180 MG PO CP24: 180.0000 mg | ORAL_CAPSULE | Freq: Every day | ORAL | 3 refills | Status: DC

## 2017-08-19 NOTE — Patient Instructions (Signed)

## 2017-09-09 DIAGNOSIS — E538 Deficiency of other specified B group vitamins: Secondary | ICD-10-CM | POA: Diagnosis not present

## 2017-09-14 DIAGNOSIS — M25512 Pain in left shoulder: Secondary | ICD-10-CM | POA: Diagnosis not present

## 2017-10-11 DIAGNOSIS — M19012 Primary osteoarthritis, left shoulder: Secondary | ICD-10-CM | POA: Diagnosis not present

## 2017-10-11 DIAGNOSIS — M25512 Pain in left shoulder: Secondary | ICD-10-CM | POA: Diagnosis not present

## 2017-10-11 DIAGNOSIS — M7582 Other shoulder lesions, left shoulder: Secondary | ICD-10-CM | POA: Diagnosis not present

## 2017-10-11 DIAGNOSIS — M25412 Effusion, left shoulder: Secondary | ICD-10-CM | POA: Diagnosis not present

## 2017-10-12 DIAGNOSIS — M4716 Other spondylosis with myelopathy, lumbar region: Secondary | ICD-10-CM | POA: Diagnosis not present

## 2017-10-15 DIAGNOSIS — M7552 Bursitis of left shoulder: Secondary | ICD-10-CM | POA: Diagnosis not present

## 2017-10-15 DIAGNOSIS — S46012A Strain of muscle(s) and tendon(s) of the rotator cuff of left shoulder, initial encounter: Secondary | ICD-10-CM | POA: Diagnosis not present

## 2017-10-15 DIAGNOSIS — M25512 Pain in left shoulder: Secondary | ICD-10-CM | POA: Diagnosis not present

## 2017-11-01 DIAGNOSIS — E538 Deficiency of other specified B group vitamins: Secondary | ICD-10-CM | POA: Diagnosis not present

## 2017-11-09 HISTORY — PX: BREAST BIOPSY: SHX20

## 2017-11-15 DIAGNOSIS — E538 Deficiency of other specified B group vitamins: Secondary | ICD-10-CM | POA: Diagnosis not present

## 2017-12-08 DIAGNOSIS — E538 Deficiency of other specified B group vitamins: Secondary | ICD-10-CM | POA: Diagnosis not present

## 2017-12-21 DIAGNOSIS — D518 Other vitamin B12 deficiency anemias: Secondary | ICD-10-CM | POA: Diagnosis not present

## 2018-01-05 DIAGNOSIS — E538 Deficiency of other specified B group vitamins: Secondary | ICD-10-CM | POA: Diagnosis not present

## 2018-01-11 DIAGNOSIS — M4716 Other spondylosis with myelopathy, lumbar region: Secondary | ICD-10-CM | POA: Diagnosis not present

## 2018-01-11 DIAGNOSIS — M2550 Pain in unspecified joint: Secondary | ICD-10-CM | POA: Diagnosis not present

## 2018-01-11 DIAGNOSIS — M150 Primary generalized (osteo)arthritis: Secondary | ICD-10-CM | POA: Diagnosis not present

## 2018-01-11 DIAGNOSIS — M4316 Spondylolisthesis, lumbar region: Secondary | ICD-10-CM | POA: Diagnosis not present

## 2018-01-11 DIAGNOSIS — R768 Other specified abnormal immunological findings in serum: Secondary | ICD-10-CM | POA: Diagnosis not present

## 2018-01-19 DIAGNOSIS — E538 Deficiency of other specified B group vitamins: Secondary | ICD-10-CM | POA: Diagnosis not present

## 2018-02-07 DIAGNOSIS — H40053 Ocular hypertension, bilateral: Secondary | ICD-10-CM | POA: Diagnosis not present

## 2018-02-09 DIAGNOSIS — E538 Deficiency of other specified B group vitamins: Secondary | ICD-10-CM | POA: Diagnosis not present

## 2018-03-14 DIAGNOSIS — E538 Deficiency of other specified B group vitamins: Secondary | ICD-10-CM | POA: Diagnosis not present

## 2018-03-18 DIAGNOSIS — M4316 Spondylolisthesis, lumbar region: Secondary | ICD-10-CM | POA: Diagnosis not present

## 2018-04-20 DIAGNOSIS — E538 Deficiency of other specified B group vitamins: Secondary | ICD-10-CM | POA: Diagnosis not present

## 2018-04-20 DIAGNOSIS — R944 Abnormal results of kidney function studies: Secondary | ICD-10-CM | POA: Diagnosis not present

## 2018-05-17 DIAGNOSIS — E162 Hypoglycemia, unspecified: Secondary | ICD-10-CM | POA: Diagnosis not present

## 2018-05-17 DIAGNOSIS — N951 Menopausal and female climacteric states: Secondary | ICD-10-CM | POA: Diagnosis not present

## 2018-05-17 DIAGNOSIS — E538 Deficiency of other specified B group vitamins: Secondary | ICD-10-CM | POA: Diagnosis not present

## 2018-05-18 DIAGNOSIS — H40053 Ocular hypertension, bilateral: Secondary | ICD-10-CM | POA: Diagnosis not present

## 2018-06-13 DIAGNOSIS — R7309 Other abnormal glucose: Secondary | ICD-10-CM | POA: Diagnosis not present

## 2018-06-13 DIAGNOSIS — E162 Hypoglycemia, unspecified: Secondary | ICD-10-CM | POA: Diagnosis not present

## 2018-06-13 DIAGNOSIS — Z6821 Body mass index (BMI) 21.0-21.9, adult: Secondary | ICD-10-CM | POA: Diagnosis not present

## 2018-06-14 DIAGNOSIS — E162 Hypoglycemia, unspecified: Secondary | ICD-10-CM | POA: Diagnosis not present

## 2018-06-14 DIAGNOSIS — R7309 Other abnormal glucose: Secondary | ICD-10-CM | POA: Diagnosis not present

## 2018-06-15 DIAGNOSIS — E538 Deficiency of other specified B group vitamins: Secondary | ICD-10-CM | POA: Diagnosis not present

## 2018-06-15 DIAGNOSIS — M47816 Spondylosis without myelopathy or radiculopathy, lumbar region: Secondary | ICD-10-CM | POA: Insufficient documentation

## 2018-06-27 DIAGNOSIS — R7309 Other abnormal glucose: Secondary | ICD-10-CM | POA: Diagnosis not present

## 2018-06-27 DIAGNOSIS — Z6821 Body mass index (BMI) 21.0-21.9, adult: Secondary | ICD-10-CM | POA: Diagnosis not present

## 2018-06-27 DIAGNOSIS — E162 Hypoglycemia, unspecified: Secondary | ICD-10-CM | POA: Diagnosis not present

## 2018-06-29 DIAGNOSIS — E538 Deficiency of other specified B group vitamins: Secondary | ICD-10-CM | POA: Diagnosis not present

## 2018-08-01 DIAGNOSIS — H40053 Ocular hypertension, bilateral: Secondary | ICD-10-CM | POA: Diagnosis not present

## 2018-08-02 DIAGNOSIS — E538 Deficiency of other specified B group vitamins: Secondary | ICD-10-CM | POA: Diagnosis not present

## 2018-08-10 ENCOUNTER — Other Ambulatory Visit: Payer: Self-pay | Admitting: Interventional Cardiology

## 2018-08-12 DIAGNOSIS — S8992XA Unspecified injury of left lower leg, initial encounter: Secondary | ICD-10-CM | POA: Diagnosis not present

## 2018-08-12 DIAGNOSIS — S0993XA Unspecified injury of face, initial encounter: Secondary | ICD-10-CM | POA: Diagnosis not present

## 2018-08-12 DIAGNOSIS — S0081XA Abrasion of other part of head, initial encounter: Secondary | ICD-10-CM | POA: Diagnosis not present

## 2018-08-12 DIAGNOSIS — S0990XA Unspecified injury of head, initial encounter: Secondary | ICD-10-CM | POA: Diagnosis not present

## 2018-08-12 DIAGNOSIS — M47812 Spondylosis without myelopathy or radiculopathy, cervical region: Secondary | ICD-10-CM | POA: Diagnosis not present

## 2018-08-12 DIAGNOSIS — S199XXA Unspecified injury of neck, initial encounter: Secondary | ICD-10-CM | POA: Diagnosis not present

## 2018-08-12 DIAGNOSIS — S0031XA Abrasion of nose, initial encounter: Secondary | ICD-10-CM | POA: Diagnosis not present

## 2018-08-12 DIAGNOSIS — S4992XA Unspecified injury of left shoulder and upper arm, initial encounter: Secondary | ICD-10-CM | POA: Diagnosis not present

## 2018-08-12 DIAGNOSIS — S99922A Unspecified injury of left foot, initial encounter: Secondary | ICD-10-CM | POA: Diagnosis not present

## 2018-08-12 DIAGNOSIS — S0083XA Contusion of other part of head, initial encounter: Secondary | ICD-10-CM | POA: Diagnosis not present

## 2018-08-23 ENCOUNTER — Other Ambulatory Visit: Payer: Self-pay | Admitting: Family Medicine

## 2018-08-23 DIAGNOSIS — Z23 Encounter for immunization: Secondary | ICD-10-CM | POA: Diagnosis not present

## 2018-08-23 DIAGNOSIS — E538 Deficiency of other specified B group vitamins: Secondary | ICD-10-CM | POA: Diagnosis not present

## 2018-08-23 DIAGNOSIS — Z1231 Encounter for screening mammogram for malignant neoplasm of breast: Secondary | ICD-10-CM

## 2018-08-26 ENCOUNTER — Ambulatory Visit
Admission: RE | Admit: 2018-08-26 | Discharge: 2018-08-26 | Disposition: A | Discharge status: Home / Self Care | Payer: 59 | Source: Ambulatory Visit | Attending: Family Medicine | Admitting: Family Medicine

## 2018-08-26 DIAGNOSIS — Z1231 Encounter for screening mammogram for malignant neoplasm of breast: Secondary | ICD-10-CM

## 2018-08-29 ENCOUNTER — Other Ambulatory Visit: Payer: Self-pay | Admitting: Family Medicine

## 2018-08-29 DIAGNOSIS — R928 Other abnormal and inconclusive findings on diagnostic imaging of breast: Secondary | ICD-10-CM

## 2018-09-12 ENCOUNTER — Other Ambulatory Visit: Payer: Self-pay | Admitting: Family Medicine

## 2018-09-12 ENCOUNTER — Ambulatory Visit
Admission: RE | Admit: 2018-09-12 | Discharge: 2018-09-12 | Disposition: A | Discharge status: Home / Self Care | Payer: 59 | Source: Ambulatory Visit | Attending: Family Medicine | Admitting: Family Medicine

## 2018-09-12 DIAGNOSIS — N6320 Unspecified lump in the left breast, unspecified quadrant: Secondary | ICD-10-CM

## 2018-09-12 DIAGNOSIS — N6489 Other specified disorders of breast: Secondary | ICD-10-CM | POA: Diagnosis not present

## 2018-09-12 DIAGNOSIS — R928 Other abnormal and inconclusive findings on diagnostic imaging of breast: Secondary | ICD-10-CM

## 2018-09-13 DIAGNOSIS — M4316 Spondylolisthesis, lumbar region: Secondary | ICD-10-CM | POA: Diagnosis not present

## 2018-09-13 DIAGNOSIS — M47816 Spondylosis without myelopathy or radiculopathy, lumbar region: Secondary | ICD-10-CM | POA: Diagnosis not present

## 2018-09-14 ENCOUNTER — Other Ambulatory Visit: Payer: Self-pay | Admitting: Interventional Cardiology

## 2018-09-14 DIAGNOSIS — E538 Deficiency of other specified B group vitamins: Secondary | ICD-10-CM | POA: Diagnosis not present

## 2018-09-15 ENCOUNTER — Other Ambulatory Visit: Payer: Self-pay | Admitting: Family Medicine

## 2018-09-15 ENCOUNTER — Ambulatory Visit
Admission: RE | Admit: 2018-09-15 | Discharge: 2018-09-15 | Disposition: A | Discharge status: Home / Self Care | Payer: 59 | Source: Ambulatory Visit | Attending: Family Medicine | Admitting: Family Medicine

## 2018-09-15 DIAGNOSIS — N6320 Unspecified lump in the left breast, unspecified quadrant: Secondary | ICD-10-CM

## 2018-09-15 DIAGNOSIS — N6321 Unspecified lump in the left breast, upper outer quadrant: Secondary | ICD-10-CM | POA: Diagnosis not present

## 2018-09-15 DIAGNOSIS — N6012 Diffuse cystic mastopathy of left breast: Secondary | ICD-10-CM | POA: Diagnosis not present

## 2018-10-05 DIAGNOSIS — E538 Deficiency of other specified B group vitamins: Secondary | ICD-10-CM | POA: Diagnosis not present

## 2018-10-25 DIAGNOSIS — E538 Deficiency of other specified B group vitamins: Secondary | ICD-10-CM | POA: Diagnosis not present

## 2018-11-15 ENCOUNTER — Ambulatory Visit: Payer: 59 | Admitting: Interventional Cardiology

## 2018-11-15 DIAGNOSIS — R768 Other specified abnormal immunological findings in serum: Secondary | ICD-10-CM | POA: Diagnosis not present

## 2018-11-15 DIAGNOSIS — M2550 Pain in unspecified joint: Secondary | ICD-10-CM | POA: Diagnosis not present

## 2018-11-15 DIAGNOSIS — M150 Primary generalized (osteo)arthritis: Secondary | ICD-10-CM | POA: Diagnosis not present

## 2018-11-22 DIAGNOSIS — E538 Deficiency of other specified B group vitamins: Secondary | ICD-10-CM | POA: Diagnosis not present

## 2018-12-22 ENCOUNTER — Encounter: Payer: Self-pay | Admitting: Interventional Cardiology

## 2018-12-23 ENCOUNTER — Other Ambulatory Visit: Payer: Self-pay | Admitting: Interventional Cardiology

## 2019-01-06 NOTE — Progress Notes (Signed)
Cardiology Office Note   Date:  01/09/2019   ID:  Melissa, Fritz 1961-03-07, MRN [id number]  PCP:  Melissa Brunette, MD    No chief complaint on file.  SVT/A. fib  Wt Readings from Last 3 Encounters:  01/09/19 170 lb 9.6 oz (77.4 kg)  08/19/17 157 lb 3.2 oz (71.3 kg)  10/22/16 164 lb (74.4 kg)       History of Present Illness: Melissa Fritz is a 58 y.o. female  who has had AFib in the past, and other arrhythmias when she was very young. She was hospitalized at a young age for arrhythmic issues. She had a failed ablation at age 42 at [facility], Dr. Cloyde Reams [phone]).   In 2018, She noted that she has palpitations if she misses her diltiazem.  Since last year, no problems with her heart.  She has felt well.  She had some family stress.  Unfortunately, her mother passed away recently.  She has not been exercising as much as usual.  She has gained some weight.  Denies : Chest pain. Dizziness. Leg edema. Nitroglycerin use. Orthopnea. Palpitations. Paroxysmal nocturnal dyspnea. Shortness of breath. Syncope.    Past Medical History:  Diagnosis Date  . Arrhythmia    afib  . DDD (degenerative disc disease)   . Family history of breast cancer   . Fibrocystic breast disease   . History of colonoscopy   . History of nuclear stress test    Myoview 12/17:  EF 62, normal perfusion; Low Risk    Past Surgical History:  Procedure Laterality Date  . BOWEL RESECTION     adhesions causing bloackage  . BREAST BIOPSY    . COLONOSCOPY  7/14  . KNEE SURGERY Right   . PARTIAL HYSTERECTOMY     one ovary remains  . ROTATOR CUFF REPAIR       Current Outpatient Medications  Medication Sig Dispense Refill  . aspirin 81 MG tablet Take 81 mg by mouth daily. STOPPED TAKING UNTIL AFTER SURGERY AS OF 10/12/16    . Cyanocobalamin (VITAMIN B-12) 1000 MCG/15ML LIQD Take 1 mL by mouth every 21 ( twenty-one) days.    Marland Kitchen diltiazem (CARDIZEM CD) 180 MG 24 hr capsule Take 1 capsule (180 mg  total) by mouth daily. 90 capsule 3  . docusate sodium (COLACE) 100 MG capsule Take 1 capsule by mouth 3 (three) times daily.    . dorzolamide (TRUSOPT) 2 % ophthalmic solution Place 1 drop into both eyes 2 (two) times daily.    Marland Kitchen ibuprofen (ADVIL,MOTRIN) 800 MG tablet TK 1 T PO TID WF OR MILK PRN    . Multiple Vitamins-Minerals (MULTIVITAMIN PO) Take 1 tablet by mouth daily. STOPPED TAKING UNTIL AFTER SURGERY AS 10/12/16    . oxyCODONE-acetaminophen (PERCOCET) 5-325 MG tablet Take 1-2 tablets by mouth every 8 (eight) hours as needed for moderate pain or severe pain.    Marland Kitchen sertraline (ZOLOFT) 50 MG tablet Take 50 mg by mouth daily.    . vitamin E 400 UNIT capsule Take 400 Units by mouth daily.     No current facility-administered medications for this visit.     Allergies:   Demerol [meperidine]; Brimonidine; and Brinzolamide-brimonidine    Social History:  The patient  reports that she has never smoked. She has never used smokeless tobacco. She reports current alcohol use. She reports that she does not use drugs.   Family History:  The patient's family history includes Atrial fibrillation in her  brother; Breast cancer in her maternal grandmother and mother; CVA in her paternal grandfather; Glaucoma in her father; Heart disease in her brother and brother; Polymyalgia rheumatica in her mother and sister.    ROS:  Please see the history of present illness.   Otherwise, review of systems are positive for weight gain.   All other systems are reviewed and negative.    PHYSICAL EXAM: VS:  BP 112/62   Pulse 72   Ht 6' (1.829 m)   Wt 170 lb 9.6 oz (77.4 kg)   SpO2 99%   BMI 23.14 kg/m  , BMI Body mass index is 23.14 kg/m. GEN: Well nourished, well developed, in no acute distress  HEENT: normal  Neck: no JVD, carotid bruits, or masses Cardiac: RRR; no murmurs, rubs, or gallops,no edema  Respiratory:  clear to auscultation bilaterally, normal work of breathing GI: soft, nontender,  nondistended, + BS MS: no deformity or atrophy  Skin: warm and dry, no rash Neuro:  Strength and sensation are intact Psych: euthymic mood, full affect   EKG:   The ekg ordered today demonstrates normal sinus rhythm, no ST segment changes, no arrhythmia   Recent Labs: No results found for requested labs within last 8760 hours.   Lipid Panel No results found for: CHOL, TRIG, HDL, CHOLHDL, VLDL, LDLCALC, LDLDIRECT   Other studies Reviewed: Additional studies/ records that were reviewed today with results demonstrating: 2012 echo showed Nl LV function; mild regurgitation, tr AI.   ASSESSMENT AND PLAN:  1. AFib: Maintaining sinus rhythm.  Continue current medications. 2. Mitral regurg/AI: No signs of heart failure.  I encouraged her to try to be more active as noted below. 3. Venous insufficiency: Followed by [facility].  No significant swelling at this time.   Current medicines are reviewed at length with the patient today.  The patient concerns regarding her medicines were addressed.  The following changes have been made:  No change  Labs/ tests ordered today include:   Orders Placed This Encounter  Procedures  . EKG 12-Lead    Recommend 150 minutes/week of aerobic exercise Low fat, low carb, high fiber diet recommended  Disposition:   FU in 1 year   Signed, Lance Muss, MD  01/09/2019 8:32 AM    [facility] [address] [place], [place], [place]  [address] Phone: [phone]; Fax: [phone]

## 2019-01-09 ENCOUNTER — Encounter: Payer: Self-pay | Admitting: Interventional Cardiology

## 2019-01-09 ENCOUNTER — Ambulatory Visit (INDEPENDENT_AMBULATORY_CARE_PROVIDER_SITE_OTHER): Payer: Managed Care, Other (non HMO) | Admitting: Interventional Cardiology

## 2019-01-09 VITALS — BP 112/62 | HR 72 | Ht 72.0 in | Wt 170.6 lb

## 2019-01-09 DIAGNOSIS — I340 Nonrheumatic mitral (valve) insufficiency: Secondary | ICD-10-CM

## 2019-01-09 DIAGNOSIS — I351 Nonrheumatic aortic (valve) insufficiency: Secondary | ICD-10-CM

## 2019-01-09 DIAGNOSIS — I480 Paroxysmal atrial fibrillation: Secondary | ICD-10-CM

## 2019-01-09 MED ORDER — DILTIAZEM HCL ER COATED BEADS 180 MG PO CP24: 180.0000 mg | ORAL_CAPSULE | Freq: Every day | ORAL | 3 refills | Status: DC

## 2019-01-09 NOTE — Patient Instructions (Signed)

## 2019-05-04 ENCOUNTER — Other Ambulatory Visit: Payer: Self-pay | Admitting: Neurosurgery

## 2019-05-04 DIAGNOSIS — Z981 Arthrodesis status: Secondary | ICD-10-CM

## 2019-05-26 ENCOUNTER — Other Ambulatory Visit: Payer: Managed Care, Other (non HMO)

## 2019-07-19 ENCOUNTER — Ambulatory Visit
Admission: RE | Admit: 2019-07-19 | Discharge: 2019-07-19 | Disposition: A | Discharge status: Home / Self Care | Payer: Managed Care, Other (non HMO) | Source: Ambulatory Visit | Attending: Neurosurgery | Admitting: Neurosurgery

## 2019-07-19 ENCOUNTER — Other Ambulatory Visit: Payer: Self-pay

## 2019-07-19 DIAGNOSIS — Z981 Arthrodesis status: Secondary | ICD-10-CM

## 2019-07-19 MED ORDER — GADOBENATE DIMEGLUMINE 529 MG/ML IV SOLN
15.0000 mL | Freq: Once | INTRAVENOUS | Status: AC | PRN
  Administered 2019-07-19: 15 mL via INTRAVENOUS

## 2019-10-17 ENCOUNTER — Other Ambulatory Visit: Payer: Self-pay | Admitting: Family Medicine

## 2019-10-17 DIAGNOSIS — Z1231 Encounter for screening mammogram for malignant neoplasm of breast: Secondary | ICD-10-CM

## 2019-10-26 ENCOUNTER — Ambulatory Visit (INDEPENDENT_AMBULATORY_CARE_PROVIDER_SITE_OTHER): Payer: Managed Care, Other (non HMO) | Admitting: Neurology

## 2019-10-26 ENCOUNTER — Other Ambulatory Visit: Payer: Self-pay

## 2019-10-26 ENCOUNTER — Encounter: Payer: Self-pay | Admitting: Neurology

## 2019-10-26 VITALS — BP 123/79 | HR 79 | Temp 97.1°F | Ht 72.0 in | Wt 170.2 lb

## 2019-10-26 DIAGNOSIS — Z981 Arthrodesis status: Secondary | ICD-10-CM | POA: Insufficient documentation

## 2019-10-26 DIAGNOSIS — M542 Cervicalgia: Secondary | ICD-10-CM

## 2019-10-26 DIAGNOSIS — G629 Polyneuropathy, unspecified: Secondary | ICD-10-CM

## 2019-10-26 DIAGNOSIS — R200 Anesthesia of skin: Secondary | ICD-10-CM | POA: Diagnosis not present

## 2019-10-26 DIAGNOSIS — M21372 Foot drop, left foot: Secondary | ICD-10-CM | POA: Insufficient documentation

## 2019-10-26 DIAGNOSIS — M069 Rheumatoid arthritis, unspecified: Secondary | ICD-10-CM | POA: Insufficient documentation

## 2019-10-26 DIAGNOSIS — M79604 Pain in right leg: Secondary | ICD-10-CM

## 2019-10-26 DIAGNOSIS — M79605 Pain in left leg: Secondary | ICD-10-CM

## 2019-10-26 NOTE — Progress Notes (Signed)
[facility]  PATIENT: ARIETTA EISENSTEIN DOB: [birth year]  REFERRING DOCTOR OR PCP: Emelda Brothers, MD; PCP is Carol Ada SOURCE: Patient, notes from Dr. Venetia Constable, imaging and lab reports, images personally reviewed.  _________________________________   HISTORICAL  CHIEF COMPLAINT:  Chief Complaint  Patient presents with  . New Patient (Initial Visit)    RM 13, alone. Paper referral for peripheral neuropathy from Dr. Zada Finders. Has had a spinal fusion in the past (L5-S1), has spinal stenosis (L4-L5). Has tingling/electric feeling from back down her legs when laying down in bed. Denies any pain associated with it. Has right foot drop.     HISTORY OF PRESENT ILLNESS:  I had the pleasure of seeing your patient, Jordane Hisle, at [facility] for neurologic consultation regarding her numbness and history of spinal fusion.  She is a 58 year old woman with a history of spinal fusion at L5S1 (PLIF 2015; Dr. Carloyn Manner) and current spinal stenosis at L4L5 who had electrical shocks down both legs, at the same time, towards the knees but not into her feet that started about 18 months ago.    Nothing seems to trigger the spells but thy are noticeable only when she is quiet and laying or relaxing.   Around the same time, she would have a separate pain and now numbness > pain into the left lower leg and foot. The symptoms are in the calf to the lateral foot and lateral sole but not involving the medial foot or leg.    Numbness is constant.    Pain is now mild.  She notes some weakness in the left foot, most noticeably with toe extension.  Additionally, the right leg began to have a foot drop and she fell, first starting about 18 months ago.    She had a NCV/EMG by Dr. Brien Few 10/16/2019.  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     She had a lumbar MRI 07/19/2019 showing:  The report  described PLIF at L5S1 without foraminal or lateral recess stenosis and disc protrusion, facet/LF hypertrophy causing mild to moderate spinal stenosis and foraminal narrowing.  I personally reviewed the images.  At L4-L5 there is a left paramedian disc protrusion causing left lateral recess stenosis with some potential to involve the left L5 nerve root.  There is left at L5-S1 without foraminal or lateral recess stenosis.  She has Rheumatoid arthritis diagnosed last year and is on Plaquenil.    Her mom had PMR.     She had small bowel obstruction and subsequent surgery and needs to take B12 supplement injections   REVIEW OF SYSTEMS: Constitutional: No fevers, chills, sweats, or change in appetite Eyes: No visual changes, double vision, eye pain Ear, nose and throat: No hearing loss, ear pain, nasal congestion, sore throat Cardiovascular: No chest pain, palpitations Respiratory: No shortness of breath at rest or with exertion.   No wheezes GastrointestinaI: No nausea, vomiting, diarrhea, abdominal pain, fecal incontinence Genitourinary: No dysuria, urinary retention or frequency.  No nocturia. Musculoskeletal: No neck pain, back pain Integumentary: No rash, pruritus, skin lesions Neurological: as above Psychiatric: No depression at this time.  No anxiety Endocrine: No palpitations, diaphoresis, change in appetite, change in weigh or increased thirst Hematologic/Lymphatic: No anemia, purpura, petechiae. Allergic/Immunologic: No itchy/runny eyes, nasal congestion, recent allergic reactions, rashes  ALLERGIES: Allergies  Allergen Reactions  . Demerol [Meperidine] Itching and Nausea And Vomiting  . Brimonidine Other (See Comments)  Eye redness  . Brinzolamide-Brimonidine Other (See Comments)    Eye redness    HOME MEDICATIONS:  Current Outpatient Medications:  .  aspirin 81 MG tablet, Take 81 mg by mouth daily. STOPPED TAKING UNTIL AFTER SURGERY AS OF 10/12/16, Disp: , Rfl:  .   Cyanocobalamin (VITAMIN B-12) 1000 MCG/15ML LIQD, Take 1 mL by mouth every 21 ( twenty-one) days., Disp: , Rfl:  .  diltiazem (CARDIZEM CD) 180 MG 24 hr capsule, Take 1 capsule (180 mg total) by mouth daily., Disp: 90 capsule, Rfl: 3 .  docusate sodium (COLACE) 100 MG capsule, Take 1 capsule by mouth 3 (three) times daily., Disp: , Rfl:  .  dorzolamide (TRUSOPT) 2 % ophthalmic solution, Place 1 drop into both eyes 2 (two) times daily., Disp: , Rfl:  .  ibuprofen (ADVIL,MOTRIN) 800 MG tablet, TK 1 T PO TID WF OR MILK PRN, Disp: , Rfl:  .  Multiple Vitamins-Minerals (MULTIVITAMIN PO), Take 1 tablet by mouth daily. STOPPED TAKING UNTIL AFTER SURGERY AS 10/12/16, Disp: , Rfl:  .  oxyCODONE-acetaminophen (PERCOCET) 5-325 MG tablet, Take 1-2 tablets by mouth every 8 (eight) hours as needed for moderate pain or severe pain., Disp: , Rfl:  .  sertraline (ZOLOFT) 50 MG tablet, Take 50 mg by mouth daily., Disp: , Rfl:  .  vitamin E 400 UNIT capsule, Take 400 Units by mouth daily., Disp: , Rfl:   PAST MEDICAL HISTORY: Past Medical History:  Diagnosis Date  . Arrhythmia    afib  . DDD (degenerative disc disease)   . Family history of breast cancer   . Fibrocystic breast disease   . History of colonoscopy   . History of nuclear stress test    Myoview 12/17:  EF 62, normal perfusion; Low Risk    PAST SURGICAL HISTORY: Past Surgical History:  Procedure Laterality Date  . BOWEL RESECTION     adhesions causing bloackage  . BREAST BIOPSY    . COLONOSCOPY  7/14  . KNEE SURGERY Right   . PARTIAL HYSTERECTOMY     one ovary remains  . ROTATOR CUFF REPAIR      FAMILY HISTORY: Family History  Problem Relation Age of Onset  . Breast cancer Mother        survivor  . Polymyalgia rheumatica Mother   . Glaucoma Father   . Polymyalgia rheumatica Sister   . Heart disease Brother   . Atrial fibrillation Brother   . Breast cancer Maternal Grandmother   . CVA Paternal Grandfather   . Heart disease  Brother   . Heart attack Neg Hx     SOCIAL HISTORY:  Social History   Socioeconomic History  . Marital status: Married    Spouse name: Theresia Lo  . Number of children: 2  . Years of education: 25  . Highest education level: Not on file  Occupational History  . Not on file  Tobacco Use  . Smoking status: Never Smoker  . Smokeless tobacco: Never Used  Substance and Sexual Activity  . Alcohol use: Yes    Comment: glass of wine rarely  . Drug use: No  . Sexual activity: Yes    Birth control/protection: None  Other Topics Concern  . Not on file  Social History Narrative   Lives with husband   Caffeine use: coffee every day and coke   Left handed   Social Determinants of Health   Financial Resource Strain:   . Difficulty of Paying Living Expenses: Not on file  Food Insecurity:   . Worried About Charity fundraiser in the Last Year: Not on file  . Ran Out of Food in the Last Year: Not on file  Transportation Needs:   . Lack of Transportation (Medical): Not on file  . Lack of Transportation (Non-Medical): Not on file  Physical Activity:   . Days of Exercise per Week: Not on file  . Minutes of Exercise per Session: Not on file  Stress:   . Feeling of Stress : Not on file  Social Connections:   . Frequency of Communication with Friends and Family: Not on file  . Frequency of Social Gatherings with Friends and Family: Not on file  . Attends Religious Services: Not on file  . Active Member of Clubs or Organizations: Not on file  . Attends Archivist Meetings: Not on file  . Marital Status: Not on file  Intimate Partner Violence:   . Fear of Current or Ex-Partner: Not on file  . Emotionally Abused: Not on file  . Physically Abused: Not on file  . Sexually Abused: Not on file     PHYSICAL EXAM  Vitals:   10/26/19 0853  BP: 123/79  Pulse: 79  Temp: (!) 97.1 F (36.2 C)  Weight: 170 lb 3.2 oz (77.2 kg)  Height: 6' (1.829 m)    Body mass index is 23.08  kg/m.   General: The patient is well-developed and well-nourished and in no acute distress  HEENT:  Head is [place]/AT.  Sclera are anicteric.     Neck: No carotid bruits are noted.  The neck is nontender with a slightly reduced ROM  Cardiovascular: The heart has a regular rate and rhythm with a normal S1 and S2. There were no murmurs, gallops or rubs.    Skin: Extremities are without rash or  edema.  Musculoskeletal:  Back is nontender  Neurologic Exam  Mental status: The patient is alert and oriented x 3 at the time of the examination. The patient has apparent normal recent and remote memory, with an apparently normal attention span and concentration ability.   Speech is normal.  Cranial nerves: Extraocular movements are full.   Facial symmetry is present. There is good facial sensation to soft touch bilaterally.Facial strength is normal.  Trapezius and sternocleidomastoid strength is normal. No dysarthria is noted.  No obvious hearing deficits are noted.  Motor:  Muscle bulk is normal.   Tone is normal. Strength is  5 / 5 in arms anf proximal legs but 4/5 in left EHL and 4+/5 in left ankle extensors and Right EHL.  .   Sensory: Sensory testing is intact to pinprick, soft touch and vibration sensation in the arms butr she has reduced sensation to vibration in both feet to about 20% of the sensation at the ankles and reduced touch sensation in lateral left foot and sole.    Coordination: Cerebellar testing reveals good finger-nose-finger and heel-to-shin bilaterally.  Gait and station: Station is normal.   Gait is normal. Tandem gait is slightly wide. Romberg is negative.   Reflexes: Deep tendon reflexes are symmetric and 2+ in arms and knees and 1 at ankles.   Plantar responses are flexor.       ASSESSMENT AND PLAN  Numbness - Plan: Multiple Myeloma Panel (SPEP&IFE w/QIG), Rheumatoid factor, MR THORACIC SPINE WO CONTRAST, MR CERVICAL SPINE WO CONTRAST  Pain in both lower  extremities - Plan: MR THORACIC SPINE WO CONTRAST  History of fusion of lumbar spine  Foot drop, left - Plan: MR THORACIC SPINE WO CONTRAST, MR CERVICAL SPINE WO CONTRAST  Neck pain - Plan: MR CERVICAL SPINE WO CONTRAST  Rheumatoid arthritis, involving unspecified site, unspecified whether rheumatoid factor present ([place]) - Plan: Rheumatoid factor, MR THORACIC SPINE WO CONTRAST, MR CERVICAL SPINE WO CONTRAST  Polyneuropathy - Plan: Multiple Myeloma Panel (SPEP&IFE w/QIG), Rheumatoid factor, Sjogren's syndrome antibods(ssa + ssb), Sedimentation rate, C-reactive Protein, Vitamin B12   In summary, Ms. Thome is a 58 year old woman with numbness who has reduced sensation in both legs/feet concerning for polyneuropathy, perhaps with a superimposed mononeuropathy involving the tibial or left lateral plantar nerve on the left.  Of note, she does have vitamin B12 deficiency but has been taking injections every 2 weeks so she is unlikely to have a low blood level.  She also has rheumatoid arthritis and an autoimmune etiology is a possibility.  Additionally, there is neck pain and we will need to also rule out myelopathy.  We will check blood work to rule out autoimmune issues that could lead to a length dependent polyneuropathy or a mononeuropathy multiplex including SPEP, Sjogren's antibodies, rheumatoid factor, CRP, ESR.  Additionally we will check MRI of the cervical and thoracic spine without contrast to rule out a compressive or intrinsic myelopathy.  We will call her with the results of the studies and she will return for visit in about 3 months, sooner depending on results if abnormal.  Thank you for asking me to see Ms. Housewright.  Please let me know if I can be of further assistance with her or other patients in the future.    [place] Frieden A. Felecia Shelling, MD, [facility] 01/75/1025, 8:52 AM Certified in Neurology, Clinical Neurophysiology, Sleep Medicine and Neuroimaging  [facility] [address] [place], [place] [address] [phone]

## 2019-10-31 ENCOUNTER — Telehealth: Payer: Self-pay | Admitting: *Deleted

## 2019-10-31 LAB — MULTIPLE MYELOMA PANEL, SERUM
Albumin SerPl Elph-Mcnc: 4.1 g/dL (ref 2.9–4.4)
Albumin/Glob SerPl: 1.6 (ref 0.7–1.7)
Alpha 1: 0.2 g/dL (ref 0.0–0.4)
Alpha2 Glob SerPl Elph-Mcnc: 0.6 g/dL (ref 0.4–1.0)
B-Globulin SerPl Elph-Mcnc: 1.2 g/dL (ref 0.7–1.3)
Gamma Glob SerPl Elph-Mcnc: 0.6 g/dL (ref 0.4–1.8)
Globulin, Total: 2.6 g/dL (ref 2.2–3.9)
IgA/Immunoglobulin A, Serum: 338 mg/dL (ref 87–352)
IgG (Immunoglobin G), Serum: 708 mg/dL (ref 586–1602)
IgM (Immunoglobulin M), Srm: 104 mg/dL (ref 26–217)
Total Protein: 6.7 g/dL (ref 6.0–8.5)

## 2019-10-31 LAB — C-REACTIVE PROTEIN: CRP: <1 mg/L (ref 0–10)

## 2019-10-31 LAB — VITAMIN B12: Vitamin B-12: 475 pg/mL (ref 232–1245)

## 2019-10-31 LAB — RHEUMATOID FACTOR: Rhuematoid fact SerPl-aCnc: 12.3 IU/mL (ref 0.0–13.9)

## 2019-10-31 LAB — SJOGREN'S SYNDROME ANTIBODS(SSA + SSB)
ENA SSA (RO) Ab: <0.2 AI (ref 0.0–0.9)
ENA SSB (LA) Ab: <0.2 AI (ref 0.0–0.9)

## 2019-10-31 LAB — SEDIMENTATION RATE: Sed Rate: 2 mm/hr (ref 0–40)

## 2019-10-31 NOTE — Telephone Encounter (Signed)
-----   Message from Star Age, MD sent at 10/31/2019  5:04 PM EST ----- Labs are unremarkable, please update patient.

## 2019-10-31 NOTE — Progress Notes (Addendum)
Created in error

## 2019-11-08 ENCOUNTER — Other Ambulatory Visit: Payer: Self-pay | Admitting: Neurology

## 2019-11-13 ENCOUNTER — Other Ambulatory Visit: Payer: Self-pay

## 2019-11-13 ENCOUNTER — Ambulatory Visit
Admission: RE | Admit: 2019-11-13 | Discharge: 2019-11-13 | Disposition: A | Discharge status: Home / Self Care | Payer: Managed Care, Other (non HMO) | Source: Ambulatory Visit | Attending: Neurology | Admitting: Neurology

## 2019-11-13 DIAGNOSIS — M069 Rheumatoid arthritis, unspecified: Secondary | ICD-10-CM

## 2019-11-13 DIAGNOSIS — R200 Anesthesia of skin: Secondary | ICD-10-CM | POA: Diagnosis not present

## 2019-11-13 DIAGNOSIS — M21372 Foot drop, left foot: Secondary | ICD-10-CM

## 2019-11-13 DIAGNOSIS — M79605 Pain in left leg: Secondary | ICD-10-CM

## 2019-11-13 DIAGNOSIS — M542 Cervicalgia: Secondary | ICD-10-CM

## 2019-11-13 DIAGNOSIS — M79604 Pain in right leg: Secondary | ICD-10-CM

## 2019-11-20 ENCOUNTER — Telehealth: Payer: Self-pay | Admitting: Neurology

## 2019-11-20 DIAGNOSIS — R29818 Other symptoms and signs involving the nervous system: Secondary | ICD-10-CM

## 2019-11-20 DIAGNOSIS — M4802 Spinal stenosis, cervical region: Secondary | ICD-10-CM

## 2019-11-20 NOTE — Telephone Encounter (Signed)
I discussed the results of the MRI with her.  The MRI of the cervical spine does show moderate spinal stenosis at C5-C6 due to a large right disc osteophyte complex.  I discussed with her that although this is a significant finding I am not certain whether or not it is responsible for her occasional shooting electric-like pain that goes down into her legs.  She also has mild spinal stenosis at C4-C5 effacing the thecal sac without causing spinal compression.  She has had some numbness and pain into the right arm at times but this usually goes into the fourth and fifth fingers and would be more likely to be an ulnar neuropathy than due to the findings in her neck.  I would like her to see Dr. Johnsie Cancel of neurosurgery again about the cervical findings.

## 2019-12-04 ENCOUNTER — Ambulatory Visit
Admission: RE | Admit: 2019-12-04 | Discharge: 2019-12-04 | Disposition: A | Discharge status: Home / Self Care | Payer: Managed Care, Other (non HMO) | Source: Ambulatory Visit | Attending: Family Medicine | Admitting: Family Medicine

## 2019-12-04 ENCOUNTER — Other Ambulatory Visit: Payer: Self-pay

## 2019-12-04 DIAGNOSIS — Z1231 Encounter for screening mammogram for malignant neoplasm of breast: Secondary | ICD-10-CM

## 2020-01-14 NOTE — Progress Notes (Signed)
Cardiology Office Note   Date:  01/15/2020   ID:  [name], Guedes 10/14/1961, MRN [id number]  PCP:  Merri Brunette, MD    No chief complaint on file.  PAF  Wt Readings from Last 3 Encounters:  01/15/20 175 lb (79.4 kg)  10/26/19 170 lb 3.2 oz (77.2 kg)  01/09/19 170 lb 9.6 oz (77.4 kg)       History of Present Illness: Melissa Fritz is a 59 y.o. female  who has had AFib in the past, and other arrhythmias when she was very young. She was hospitalized at a young age for arrhythmic issues. She had a failed ablation at age 58 at [facility], Dr. Cloyde Reams [phone]).  In 2018, She noted that she has palpitations if she misses her diltiazem.  Denies : Chest pain. Dizziness. Leg edema. Nitroglycerin use. Orthopnea. Paroxysmal nocturnal dyspnea. Shortness of breath. Syncope.   Rare palpitations.   Foot pain, foot drop, nerve issues,  spinal stenosis limits walking.     Past Medical History:  Diagnosis Date  . Arrhythmia    afib  . DDD (degenerative disc disease)   . Family history of breast cancer   . Fibrocystic breast disease   . History of colonoscopy   . History of nuclear stress test    Myoview 12/17:  EF 62, normal perfusion; Low Risk    Past Surgical History:  Procedure Laterality Date  . BOWEL RESECTION     adhesions causing bloackage  . BREAST BIOPSY Left 2019  . BREAST BIOPSY Left 2018  . COLONOSCOPY  7/14  . KNEE SURGERY Right   . PARTIAL HYSTERECTOMY     one ovary remains  . ROTATOR CUFF REPAIR       Current Outpatient Medications  Medication Sig Dispense Refill  . aspirin 81 MG tablet Take 81 mg by mouth daily. STOPPED TAKING UNTIL AFTER SURGERY AS OF 10/12/16    . Cyanocobalamin (VITAMIN B-12) 1000 MCG/15ML LIQD Take 1 mL by mouth every 21 ( twenty-one) days.    Marland Kitchen diltiazem (CARDIZEM CD) 180 MG 24 hr capsule Take 1 capsule (180 mg total) by mouth daily. 90 capsule 3  . docusate sodium (COLACE) 100 MG capsule Take 1 capsule by mouth 3 (three)  times daily.    . hydroxychloroquine (PLAQUENIL) 200 MG tablet Take 200 mg by mouth 2 (two) times daily.    Marland Kitchen ibuprofen (ADVIL,MOTRIN) 800 MG tablet TK 1 T PO TID WF OR MILK PRN    . Multiple Vitamins-Minerals (MULTIVITAMIN PO) Take 1 tablet by mouth daily. STOPPED TAKING UNTIL AFTER SURGERY AS 10/12/16    . oxyCODONE-acetaminophen (PERCOCET) 5-325 MG tablet Take 1-2 tablets by mouth every 8 (eight) hours as needed for moderate pain or severe pain.    Marland Kitchen sertraline (ZOLOFT) 50 MG tablet Take 50 mg by mouth daily.    . vitamin E 400 UNIT capsule Take 400 Units by mouth daily.     No current facility-administered medications for this visit.    Allergies:   Demerol [meperidine], Brimonidine, and Brinzolamide-brimonidine    Social History:  The patient  reports that she has never smoked. She has never used smokeless tobacco. She reports current alcohol use. She reports that she does not use drugs.   Family History:  The patient's family history includes Atrial fibrillation in her brother; Breast cancer in her maternal grandmother and mother; CVA in her paternal grandfather; Glaucoma in her father; Heart disease in her brother and brother;  Polymyalgia rheumatica in her mother and sister.    ROS:  Please see the history of present illness.   Otherwise, review of systems are positive for foot issues.   All other systems are reviewed and negative.    PHYSICAL EXAM: VS:  BP 122/80   Pulse 81   Ht 6' (1.829 m)   Wt 175 lb (79.4 kg)   SpO2 97%   BMI 23.73 kg/m  , BMI Body mass index is 23.73 kg/m. GEN: Well nourished, well developed, in no acute distress  HEENT: normal  Neck: no JVD, carotid bruits, or masses Cardiac: RRR; no murmurs, rubs, or gallops,no edema  Respiratory:  clear to auscultation bilaterally, normal work of breathing GI: soft, nontender, nondistended, + BS MS: no deformity or atrophy  Skin: warm and dry, no rash Neuro:  Strength and sensation are intact Psych: euthymic  mood, full affect   EKG:   The ekg ordered today demonstrates NSR, no ST changes   Recent Labs: No results found for requested labs within last 8760 hours.   Lipid Panel No results found for: CHOL, TRIG, HDL, CHOLHDL, VLDL, LDLCALC, LDLDIRECT   Other studies Reviewed: Additional studies/ records that were reviewed today with results demonstrating: labs from neuro reviewed   ASSESSMENT AND PLAN:  1. PAF: She has been maintaining sinus rhythm.  Continue current medications including diltiazem.  She would like to consider decreasing frequency of medicine. 2. Mitral regurgitation/aortic insufficiency: Continue to stay active.  No CHF.  Murmurs barely noticeable.  3. Venous insufficiency: Followed by [facility].  Elevate legs.  Sclerotherapy a few years ago.  Has not seen them in a while.  4. Got her J&J shot a few days ago for COVID.    Current medicines are reviewed at length with the patient today.  The patient concerns regarding her medicines were addressed.  The following changes have been made:  No change  Labs/ tests ordered today include:  No orders of the defined types were placed in this encounter.   Recommend 150 minutes/week of aerobic exercise Low fat, low carb, high fiber diet recommended  Disposition:   FU in 1 year   Signed, Larae Grooms, MD  01/15/2020 8:14 AM    [facility] [place], [place],   [address] Phone: [phone]; Fax: [phone]

## 2020-01-15 ENCOUNTER — Encounter: Payer: Self-pay | Admitting: Interventional Cardiology

## 2020-01-15 ENCOUNTER — Other Ambulatory Visit: Payer: Self-pay

## 2020-01-15 ENCOUNTER — Ambulatory Visit (INDEPENDENT_AMBULATORY_CARE_PROVIDER_SITE_OTHER): Payer: Managed Care, Other (non HMO) | Admitting: Interventional Cardiology

## 2020-01-15 VITALS — BP 122/80 | HR 81 | Ht 72.0 in | Wt 175.0 lb

## 2020-01-15 DIAGNOSIS — I480 Paroxysmal atrial fibrillation: Secondary | ICD-10-CM | POA: Diagnosis not present

## 2020-01-15 DIAGNOSIS — I351 Nonrheumatic aortic (valve) insufficiency: Secondary | ICD-10-CM

## 2020-01-15 DIAGNOSIS — I872 Venous insufficiency (chronic) (peripheral): Secondary | ICD-10-CM

## 2020-01-15 DIAGNOSIS — I340 Nonrheumatic mitral (valve) insufficiency: Secondary | ICD-10-CM

## 2020-01-15 NOTE — Patient Instructions (Signed)

## 2020-01-29 ENCOUNTER — Ambulatory Visit: Payer: Managed Care, Other (non HMO) | Admitting: Neurology

## 2020-02-01 ENCOUNTER — Other Ambulatory Visit: Payer: Self-pay

## 2020-02-01 MED ORDER — DILTIAZEM HCL ER COATED BEADS 180 MG PO CP24: 180.0000 mg | ORAL_CAPSULE | Freq: Every day | ORAL | 3 refills | Status: DC

## 2020-04-17 IMAGING — MG STEREOTACTIC CORE NEEDLE BIOPSY
8 of 12 series · 8 of 24 positions shown · non-contrast
Comparison: Previous exams.

Addendum:
CLINICAL DATA: 57-year-old female with an indeterminate left breast
mass.

EXAM:
LEFT BREAST STEREOTACTIC CORE NEEDLE BIOPSY

[L (1 of 8)]
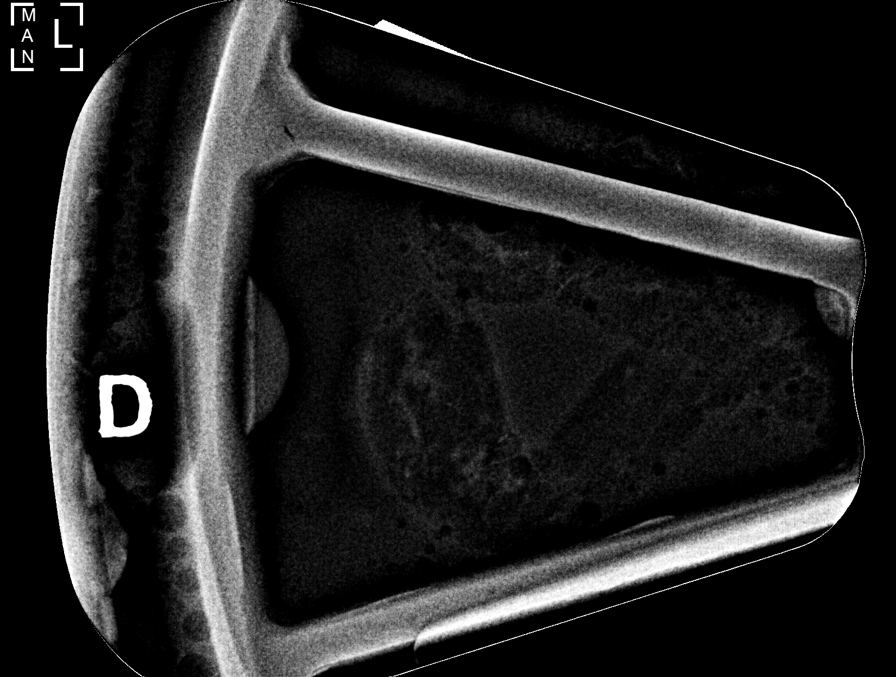

[L (2 of 8)]
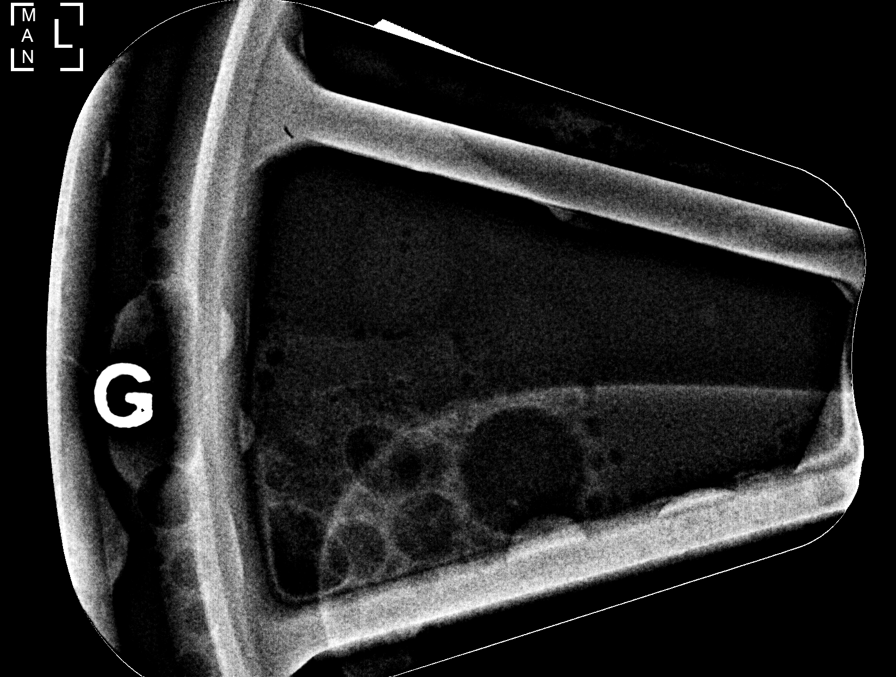

[L (3 of 8)]
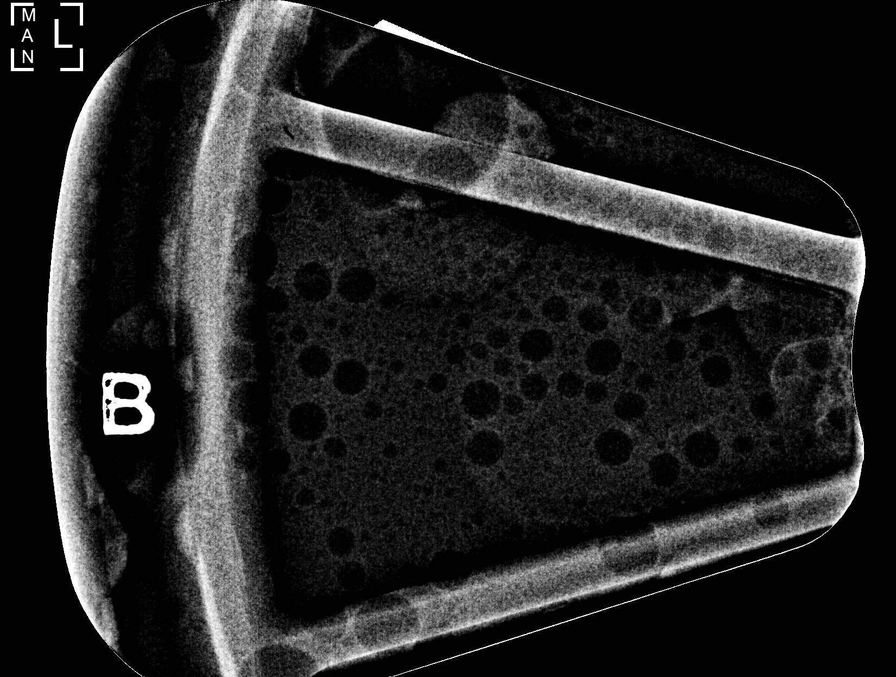

[L (4 of 8)]
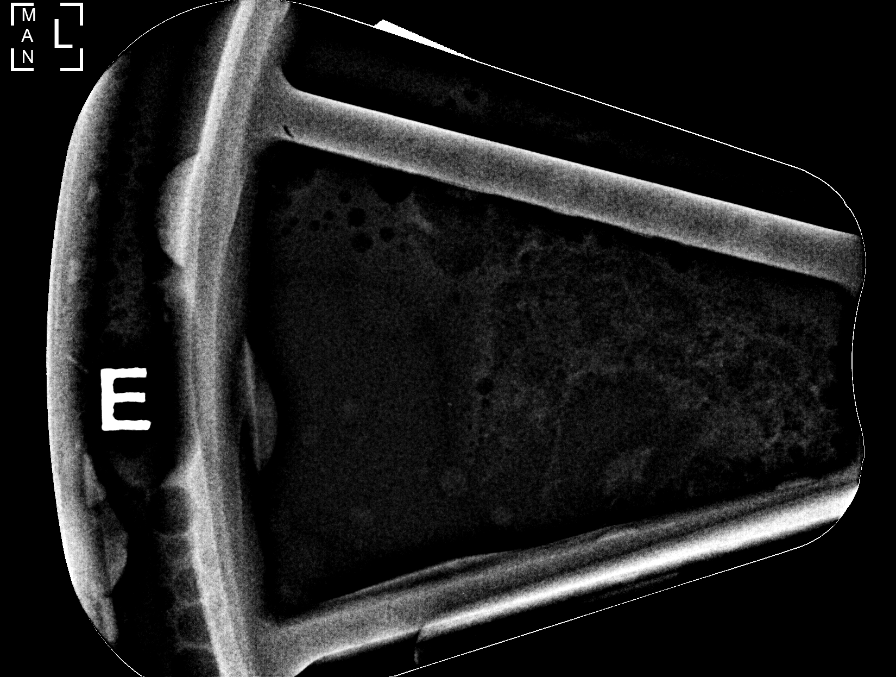

[L (5 of 8)]
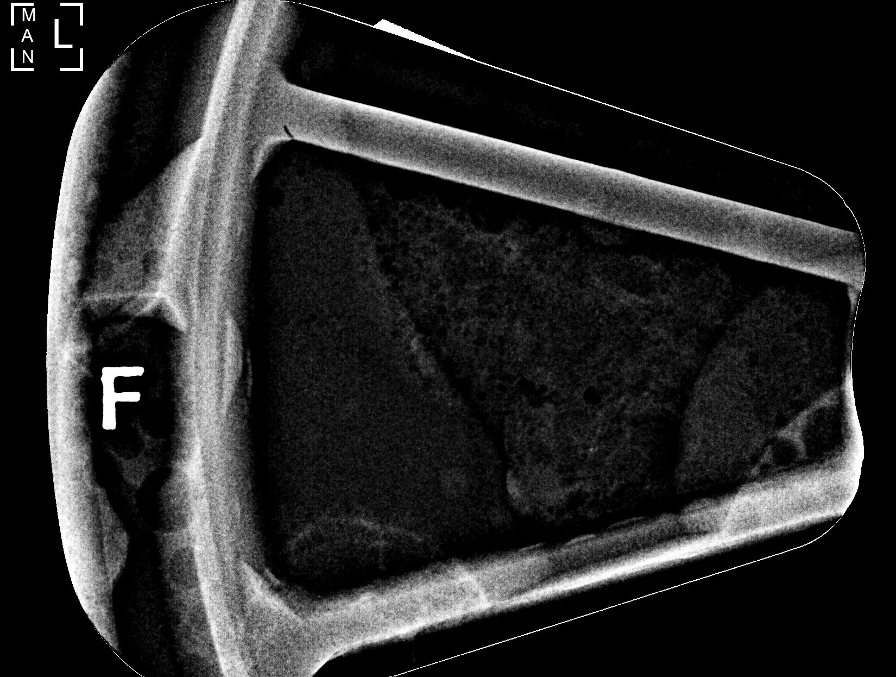

[L (6 of 8)]
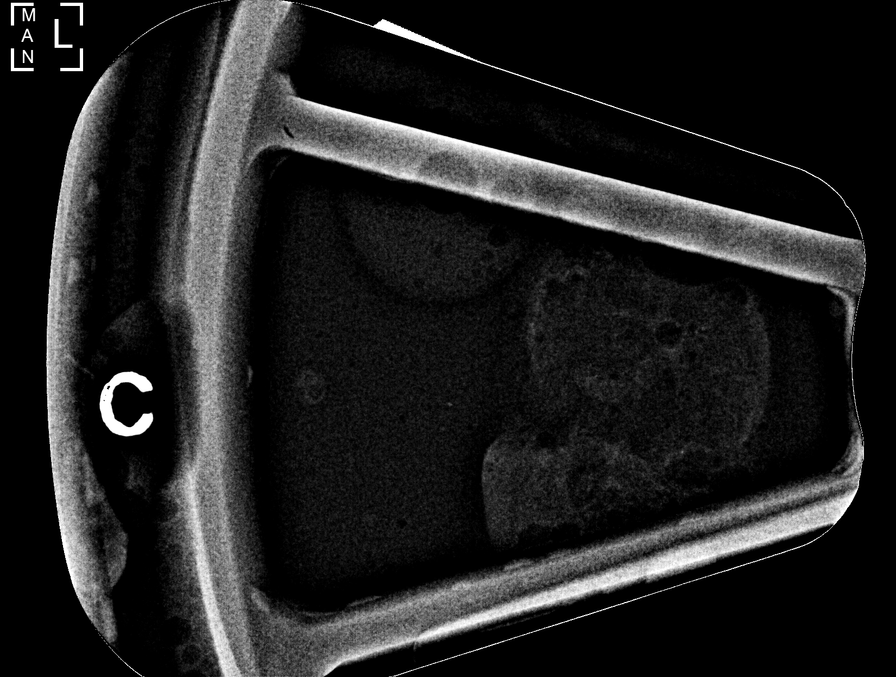

[L (7 of 8)]
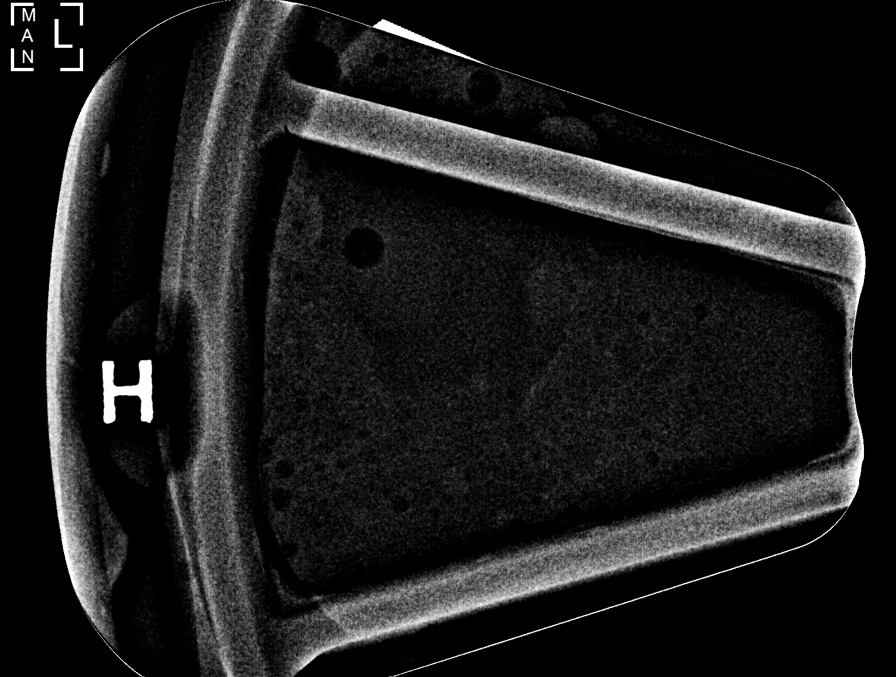

[L (8 of 8)]
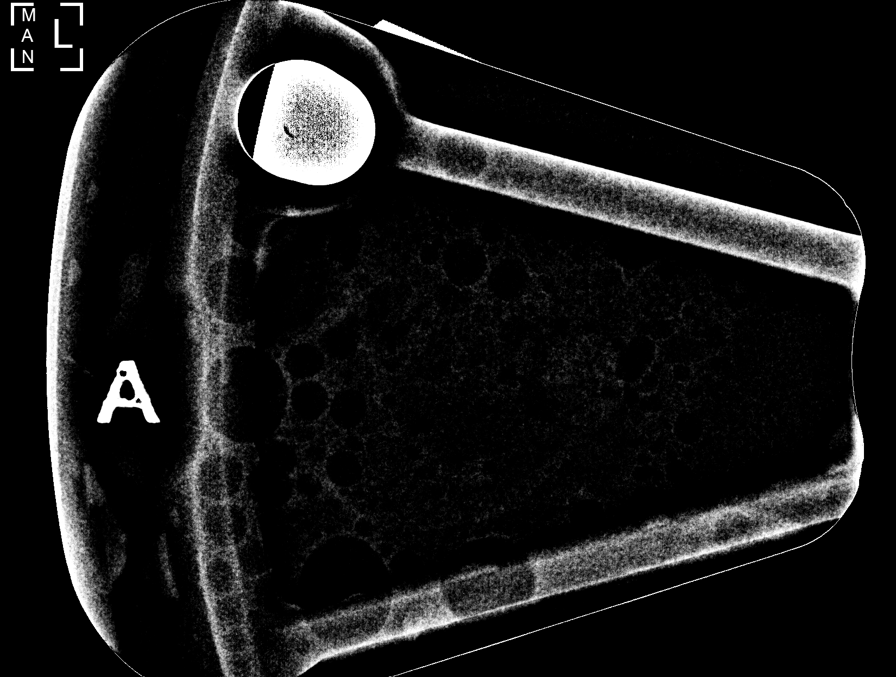

[8 of 24 positions shown; findings below may reference images not displayed]



Using sterile technique and 1% Lidocaine as local anesthetic, under
stereotactic guidance, a 9 gauge vacuum assisted device was used to
perform core needle biopsy of a mass in the upper outer quadrant of
the left breast using a lateral approach.

Lesion quadrant: Upper outer quadrant

At the conclusion of the procedure, a X shaped tissue marker clip
was deployed into the biopsy cavity. Follow-up 2-view mammogram was
performed and dictated separately.
IMPRESSION: Stereotactic-guided biopsy of the left breast. No apparent
complications.

ADDENDUM:
Pathology revealed BENIGN BREAST TISSUE WITH FIBROCYSTIC CHANGE of
the Left breast, upper outer quadrant. This was found to be
concordant by Dr. Jens Chr Clemmesen.

Pathology results were discussed with the patient by telephone. The
patient reported doing well after the biopsy with tenderness at the
site. Post biopsy instructions and care were reviewed and questions
were answered. The patient was encouraged to call The [REDACTED]

The patient was instructed to return for annual screening
mammography and informed a reminder notice would be sent regarding
this appointment.

Pathology results reported by Ferienhaus Erxleben, RN on 09/19/2018.

*** End of Addendum ***

## 2020-04-17 IMAGING — MG MM CLIP PLACEMENT
6 series · 6 of 18 positions shown · non-contrast
Comparison: Previous exam(s).

CLINICAL DATA: 57-year-old female status post stereotactic biopsy
of the left breast.

EXAM:
DIAGNOSTIC LEFT MAMMOGRAM POST STEREOTACTIC BIOPSY

[L CC synth-2D]
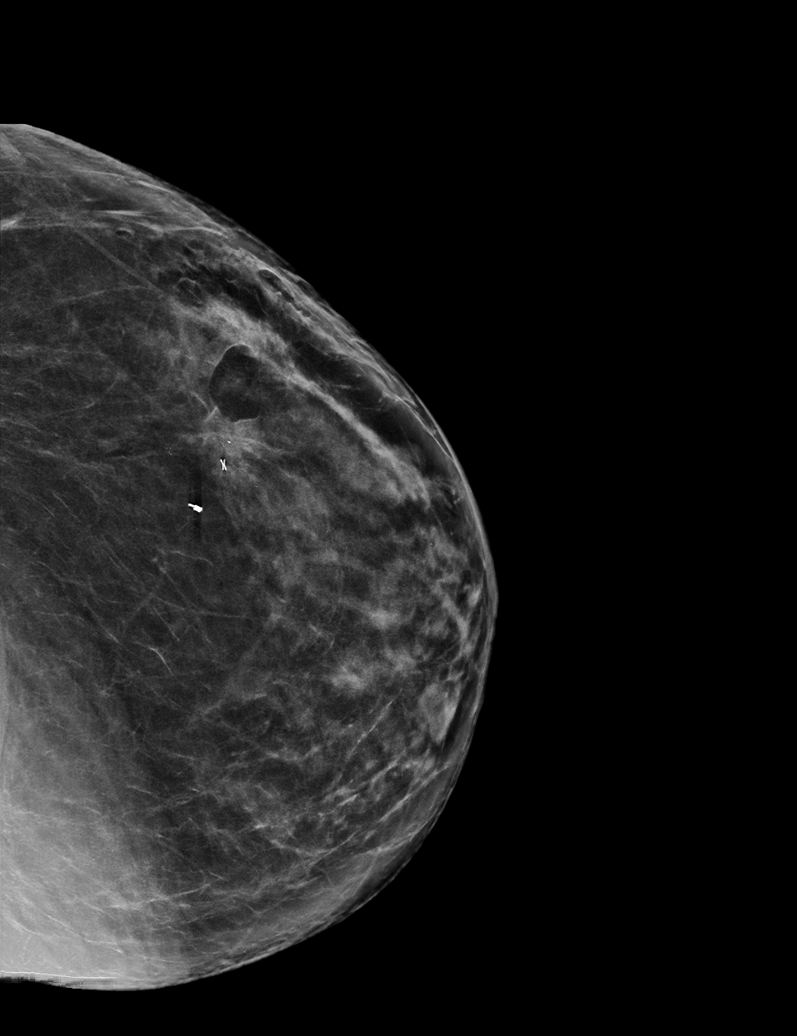

[L MLO synth-2D]
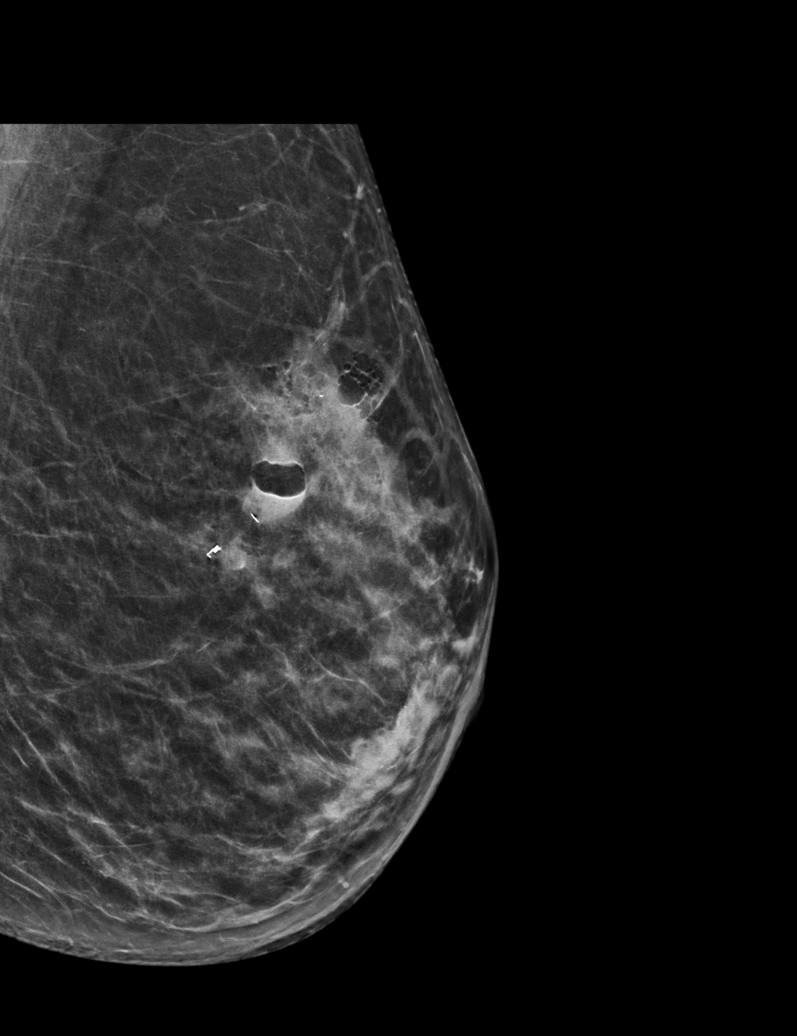

[L ML synth-2D]
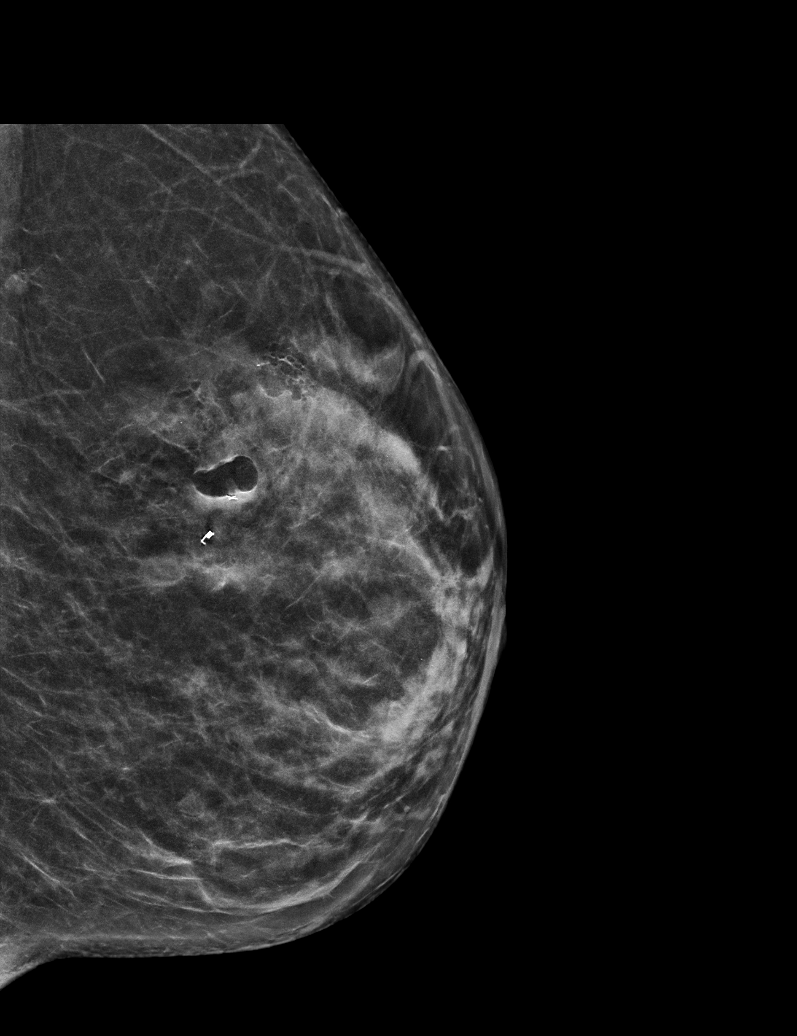

[L ML tomo · tomo slice 31/61.0]
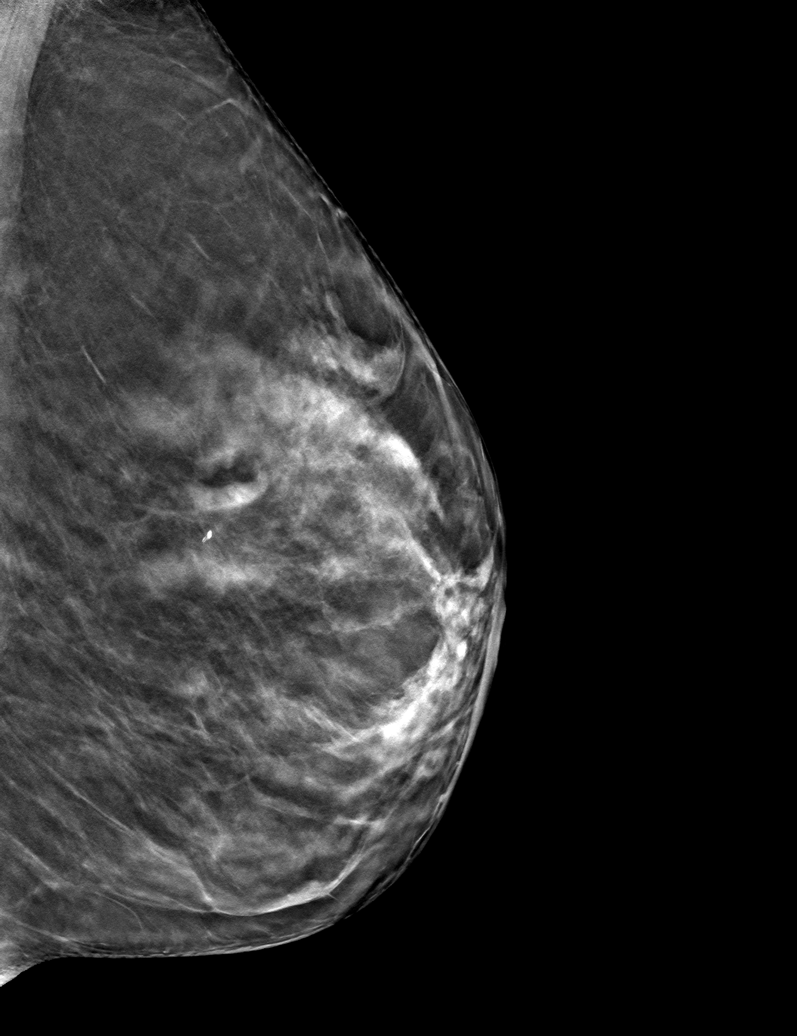

[L MLO tomo · tomo slice 31/62.0]
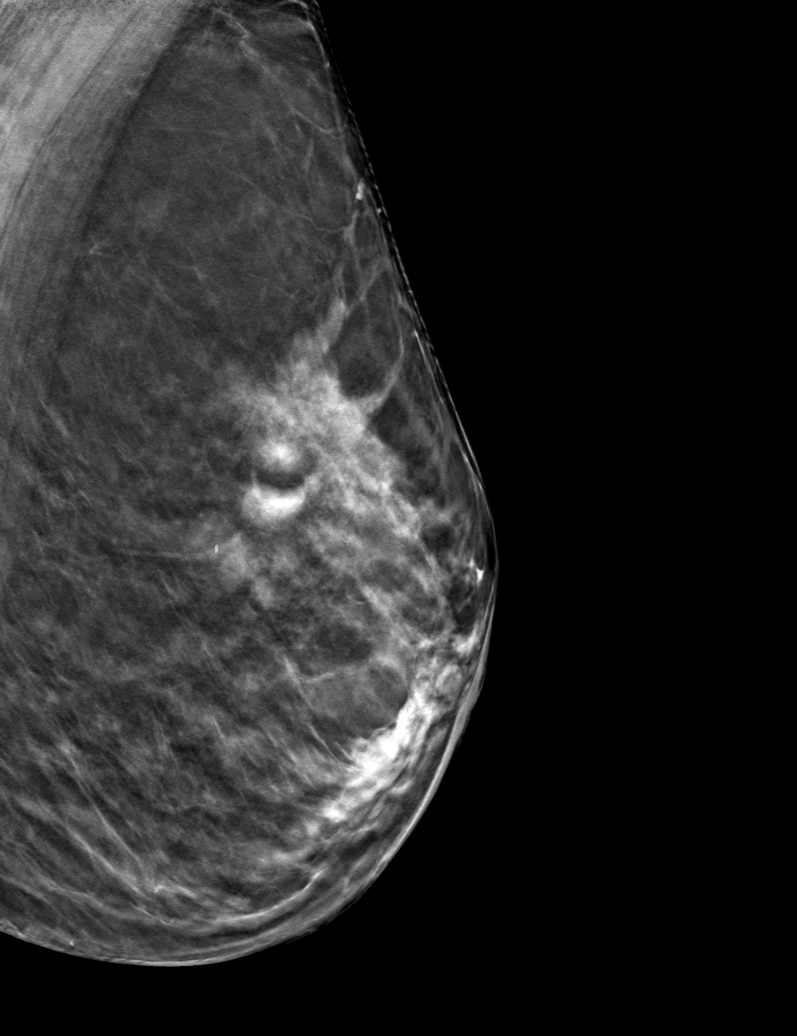

[L CC tomo · tomo slice 36/71.0]
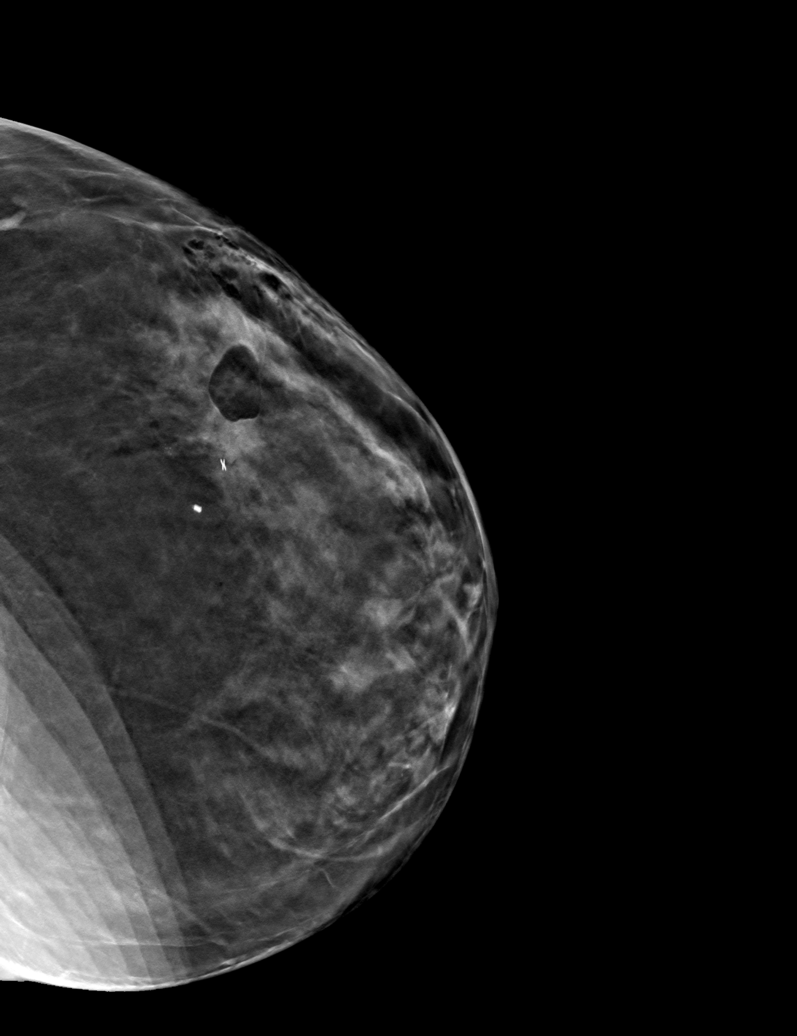

[6 of 18 positions shown; findings below may reference images not displayed]

FINDINGS: Mammographic images were obtained following stereotactic guided
biopsy of the left breast. X shaped clip is identified in the upper
outer quadrant at mid to posterior depth.
IMPRESSION: Post biopsy clip as described.

Final Assessment: Post Procedure Mammograms for Marker Placement

## 2020-05-22 ENCOUNTER — Encounter: Payer: Self-pay | Admitting: Podiatry

## 2020-05-22 ENCOUNTER — Ambulatory Visit (INDEPENDENT_AMBULATORY_CARE_PROVIDER_SITE_OTHER): Payer: Managed Care, Other (non HMO)

## 2020-05-22 ENCOUNTER — Other Ambulatory Visit: Payer: Self-pay | Admitting: Podiatry

## 2020-05-22 ENCOUNTER — Other Ambulatory Visit: Payer: Self-pay

## 2020-05-22 ENCOUNTER — Ambulatory Visit (INDEPENDENT_AMBULATORY_CARE_PROVIDER_SITE_OTHER): Payer: Managed Care, Other (non HMO) | Admitting: Podiatry

## 2020-05-22 VITALS — Temp 97.9°F

## 2020-05-22 DIAGNOSIS — R2681 Unsteadiness on feet: Secondary | ICD-10-CM | POA: Diagnosis not present

## 2020-05-22 DIAGNOSIS — M76812 Anterior tibial syndrome, left leg: Secondary | ICD-10-CM | POA: Diagnosis not present

## 2020-05-22 DIAGNOSIS — G629 Polyneuropathy, unspecified: Secondary | ICD-10-CM

## 2020-05-22 DIAGNOSIS — M79672 Pain in left foot: Secondary | ICD-10-CM

## 2020-05-22 NOTE — Progress Notes (Signed)
Subjective:   Patient ID: Melissa Fritz, female   DOB: 59 y.o.   MRN: [id number]   HPI Patient presents with chronic neuropathy-like scenario with balance issues and foot drop bilateral.  She has had spinal fusion which is probably contributory to this and does have rheumatoid arthritis and presents with exquisite discomfort on the dorsal medial aspect of the left foot along with the balance issues that she is concerned about.  Patient does not smoke and likes to be active   Review of Systems  All other systems reviewed and are negative.       Objective:  Physical Exam Vitals and nursing note reviewed.  Constitutional:      Appearance: She is well-developed.  Pulmonary:     Effort: Pulmonary effort is normal.  Musculoskeletal:        General: Normal range of motion.  Skin:    General: Skin is warm.  Neurological:     Mental Status: She is alert.     Neurovascular status intact muscle strength was found to be adequate range of motion within normal limits.  Patient is noted to diminishment of sharp dull vibratory varus bilateral and what appears to be significant diminishment of extensor function bilateral.  Anterior tibial is functioning bilateral but on the left there is quite a bit of inflammation pain around the insertion into the cuneiform with probable overutilization of the tendon trying to compensate for extensor tendon.  She does have balance issues and has had numerous falls and is concerned about her inability to be active like she used to be     Assessment:  Acute anterior tibial tendinitis left chronic polyneuropathy bilateral with probable extensor function abnormality and gait instability     Plan:  H&P reviewed condition and I did do a careful steroid injection of the insertion of the anterior tib into the left foot with 3 mg Dexasone Kenalog 5 mg Xylocaine and applied fascial brace to lift up the arch.  Gave instructions on ice therapy and I discussed balance  bracing or possible AFO bracing to try to keep her active along with physical therapy.  I will have her back in 2 weeks and we will decide what would be helpful for her  X-rays indicate that there is no current indications of collapse medial longitudinal arch no indications of pathology around the anterior tibial tendon insertion bilateral

## 2020-06-10 ENCOUNTER — Ambulatory Visit: Payer: Managed Care, Other (non HMO) | Admitting: Podiatry

## 2020-09-05 ENCOUNTER — Ambulatory Visit: Payer: Managed Care, Other (non HMO)

## 2020-09-05 ENCOUNTER — Ambulatory Visit: Payer: Managed Care, Other (non HMO) | Attending: Internal Medicine

## 2020-09-05 DIAGNOSIS — Z23 Encounter for immunization: Secondary | ICD-10-CM

## 2020-09-05 NOTE — Progress Notes (Signed)
   Covid-19 Vaccination Clinic  Name:  Melissa Fritz    MRN: [id number] DOB: 02-01-1961  09/05/2020  [name] was observed post Covid-19 immunization for 15 minutes without incident. She was provided with Vaccine Information Sheet and instruction to access the V-Safe system.   [name] was instructed to call 911 with any severe reactions post vaccine: Marland Kitchen Difficulty breathing  . Swelling of face and throat  . A fast heartbeat  . A bad rash all over body  . Dizziness and weakness

## 2020-11-20 ENCOUNTER — Other Ambulatory Visit: Payer: Self-pay | Admitting: Family Medicine

## 2020-11-20 DIAGNOSIS — Z1231 Encounter for screening mammogram for malignant neoplasm of breast: Secondary | ICD-10-CM

## 2020-12-12 ENCOUNTER — Ambulatory Visit (INDEPENDENT_AMBULATORY_CARE_PROVIDER_SITE_OTHER): Payer: Managed Care, Other (non HMO) | Admitting: Neurology

## 2020-12-12 ENCOUNTER — Encounter: Payer: Self-pay | Admitting: Neurology

## 2020-12-12 VITALS — BP 141/78 | HR 78 | Ht 72.0 in | Wt 180.5 lb

## 2020-12-12 DIAGNOSIS — G629 Polyneuropathy, unspecified: Secondary | ICD-10-CM | POA: Insufficient documentation

## 2020-12-12 DIAGNOSIS — R200 Anesthesia of skin: Secondary | ICD-10-CM | POA: Diagnosis not present

## 2020-12-12 DIAGNOSIS — M79605 Pain in left leg: Secondary | ICD-10-CM

## 2020-12-12 DIAGNOSIS — M542 Cervicalgia: Secondary | ICD-10-CM

## 2020-12-12 DIAGNOSIS — M069 Rheumatoid arthritis, unspecified: Secondary | ICD-10-CM | POA: Diagnosis not present

## 2020-12-12 DIAGNOSIS — M4802 Spinal stenosis, cervical region: Secondary | ICD-10-CM

## 2020-12-12 MED ORDER — GABAPENTIN 300 MG PO CAPS: ORAL_CAPSULE | ORAL | 5 refills | Status: DC

## 2020-12-12 NOTE — Progress Notes (Signed)
[facility]  PATIENT: Melissa Fritz DOB: [birth year]  REFERRING DOCTOR OR PCP: Autumn Patty, MD; PCP is Merri Brunette SOURCE: Patient, notes from Dr. Johnsie Cancel, imaging and lab reports, images personally reviewed.  _________________________________   HISTORICAL  CHIEF COMPLAINT:  Chief Complaint  Patient presents with  . Follow-up    RM 12, alone. Last seen 10/26/2019. Here to f/u on peripheral neuropathy. Having electric pain down legs/numb/pain in right arm. Saw neurosurgery (Dr. Maurice Small) who did not recommend suregry. Felt imaging did not support cause of sx, wanted her to follow back up here. She is having back pain that radiates down left leg. More sensitive to heat in feet. Stepped on vent and thought her feet got burnt (it was not that hot).    HISTORY OF PRESENT ILLNESS:  Melissa Fritz is a 60 y.o. woman with polyneuropathy.     12/12/2020; She has numbness with a burning sensation in her left foot .   She also has LBP radiating into the left buttock and then into the left foot.  She also saw podiatry and had a left foot cortisone injection with some benefit.    When is down the back of the left leg and into the foot.   She has altered sensation in the left foot, much more so L5 distribution than S1.    Her right foot has mild plantar allodynia but no numbness, pain or weakness.     When she lays down and is still.  She has a sensation going down the back into her legs like an electrical wave.    She takes gabapentin 300 mg po qHS and oxycodone 10 mg po tid.    She sees Dr. Ollen Bowl.   He has also done ESIs but they did not help.     Reviewed MRIs of the lumbar, thracic and cervical spine.    She has Rheumatoid arthritis diagnosed last year and is on Plaquenil.    Her mom had PMR.     She had small bowel obstruction and subsequent surgery and needs to take B12 supplement injections  History from initial consultation: She is a 60 year old woman  with a history of spinal fusion at L5S1 (PLIF 2015; Dr. Channing Mutters) and current spinal stenosis at L4L5 who had electrical shocks down both legs, at the same time, towards the knees but not into her feet that started about 18 months ago.    Nothing seems to trigger the spells but thy are noticeable only when she is quiet and laying or relaxing.   Around the same time, she would have a separate pain and now numbness > pain into the left lower leg and foot. The symptoms are in the calf to the lateral foot and lateral sole but not involving the medial foot or leg.    Numbness is constant.    Pain is now mild.  She notes some weakness in the left foot, most noticeably with toe extension.  Additionally, the right leg began to have a foot drop and she fell, first starting about 18 months ago.    Studies: She had a NCV/EMG by Dr. Murray Hodgkins 10/16/2019.  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     Imaging: MRI of the lumbar spine  07/19/2019 showing:  At L4-L5 there is a left paramedian disc protrusion causing left lateral recess stenosis with some potential to involve the left L5 nerve root.  Prior  fusion at L5-S1.  MRI of the cervical spine 11/13/2019 shows right paramedian disc protrusion at C5-C6 causing moderate spinal stenosis that could affect the right C6 nerve root and moderate degenerative changes elsewhere.  There is mild spinal stenosis at C2-C3 the spinal cord appears normal.  There is a T2 hyperintense focus within the pons  MRI of the thoracic spine 11/13/2019 shows a normal spinal cord and very minimal degenerative changes that do not lead to nerve root compression or spinal stenosis.  REVIEW OF SYSTEMS: Constitutional: No fevers, chills, sweats, or change in appetite Eyes: No visual changes, double vision, eye pain Ear, nose and throat: No hearing loss, ear pain, nasal congestion, sore throat Cardiovascular: No chest pain,  palpitations Respiratory: No shortness of breath at rest or with exertion.   No wheezes GastrointestinaI: No nausea, vomiting, diarrhea, abdominal pain, fecal incontinence Genitourinary: No dysuria, urinary retention or frequency.  No nocturia. Musculoskeletal:As above.  She also has rheumatoid arthritis. Integumentary: No rash, pruritus, skin lesions Neurological: as above Psychiatric: No depression at this time.  No anxiety.  She is on Zoloft. Endocrine: No palpitations, diaphoresis, change in appetite, change in weigh or increased thirst Hematologic/Lymphatic: No anemia, purpura, petechiae. Allergic/Immunologic: No itchy/runny eyes, nasal congestion, recent allergic reactions, rashes  ALLERGIES: Allergies  Allergen Reactions  . Demerol [Meperidine] Itching and Nausea And Vomiting  . Brimonidine Other (See Comments)    Eye redness  . Brinzolamide-Brimonidine Other (See Comments)    Eye redness    HOME MEDICATIONS:  Current Outpatient Medications:  .  aspirin 81 MG tablet, Take 81 mg by mouth daily. STOPPED TAKING UNTIL AFTER SURGERY AS OF 10/12/16, Disp: , Rfl:  .  Cyanocobalamin (VITAMIN B-12) 1000 MCG/15ML LIQD, Take 1 mL by mouth every 21 ( twenty-one) days., Disp: , Rfl:  .  diltiazem (CARDIZEM CD) 180 MG 24 hr capsule, Take 1 capsule (180 mg total) by mouth daily., Disp: 90 capsule, Rfl: 3 .  docusate sodium (COLACE) 100 MG capsule, Take 1 capsule by mouth 3 (three) times daily., Disp: , Rfl:  .  hydroxychloroquine (PLAQUENIL) 200 MG tablet, Take 200 mg by mouth 2 (two) times daily., Disp: , Rfl:  .  ibuprofen (ADVIL,MOTRIN) 800 MG tablet, TK 1 T PO TID WF OR MILK PRN, Disp: , Rfl:  .  Multiple Vitamins-Minerals (MULTIVITAMIN PO), Take 1 tablet by mouth daily. STOPPED TAKING UNTIL AFTER SURGERY AS 10/12/16, Disp: , Rfl:  .  oxyCODONE-acetaminophen (PERCOCET/ROXICET) 5-325 MG tablet, Take 1-2 tablets by mouth every 8 (eight) hours as needed for moderate pain or severe pain.,  Disp: , Rfl:  .  sertraline (ZOLOFT) 50 MG tablet, Take 50 mg by mouth daily., Disp: , Rfl:  .  vitamin E 400 UNIT capsule, Take 400 Units by mouth daily., Disp: , Rfl:  .  gabapentin (NEURONTIN) 300 MG capsule, One po qAM, one po qPM and two po qHS, Disp: 120 capsule, Rfl: 5  PAST MEDICAL HISTORY: Past Medical History:  Diagnosis Date  . Arrhythmia    afib  . DDD (degenerative disc disease)   . Family history of breast cancer   . Fibrocystic breast disease   . History of colonoscopy   . History of nuclear stress test    Myoview 12/17:  EF 62, normal perfusion; Low Risk    PAST SURGICAL HISTORY: Past Surgical History:  Procedure Laterality Date  . BOWEL RESECTION     adhesions causing bloackage  . BREAST BIOPSY Left 2019  . BREAST BIOPSY  Left 2018  . COLONOSCOPY  7/14  . KNEE SURGERY Right   . PARTIAL HYSTERECTOMY     one ovary remains  . ROTATOR CUFF REPAIR      FAMILY HISTORY: Family History  Problem Relation Age of Onset  . Breast cancer Mother        survivor  . Polymyalgia rheumatica Mother   . Glaucoma Father   . Polymyalgia rheumatica Sister   . Heart disease Brother   . Atrial fibrillation Brother   . Breast cancer Maternal Grandmother   . CVA Paternal Grandfather   . Heart disease Brother   . Heart attack Neg Hx     SOCIAL HISTORY:  Social History   Socioeconomic History  . Marital status: Married    Spouse name: Pennie Rushing  . Number of children: 2  . Years of education: 3  . Highest education level: Not on file  Occupational History  . Not on file  Tobacco Use  . Smoking status: Never Smoker  . Smokeless tobacco: Never Used  Vaping Use  . Vaping Use: Never used  Substance and Sexual Activity  . Alcohol use: Yes    Comment: glass of wine rarely  . Drug use: No  . Sexual activity: Yes    Birth control/protection: None  Other Topics Concern  . Not on file  Social History Narrative   Lives with husband   Caffeine use: coffee every day and  coke   Left handed   Social Determinants of Health   Financial Resource Strain: Not on file  Food Insecurity: Not on file  Transportation Needs: Not on file  Physical Activity: Not on file  Stress: Not on file  Social Connections: Not on file  Intimate Partner Violence: Not on file     PHYSICAL EXAM  Vitals:   12/12/20 1406  BP: (!) 141/78  Pulse: 78  SpO2: 95%  Weight: 180 lb 8 oz (81.9 kg)  Height: 6' (1.829 m)    Body mass index is 24.48 kg/m.   General: The patient is well-developed and well-nourished and in no acute distress  HEENT:  Head is [place]/AT.  Sclera are anicteric.     Neck  The neck is nontender with a slightly reduced ROM  Skin: Extremities are without rash or  edema.  Musculoskeletal:  Back is nontender  Neurologic Exam  Mental status: The patient is alert and oriented x 3 at the time of the examination. The patient has apparent normal recent and remote memory, with an apparently normal attention span and concentration ability.   Speech is normal.  Cranial nerves: Extraocular movements are full.   Facial symmetry is present. There is good facial sensation to soft touch bilaterally.Facial strength is normal.  Trapezius and sternocleidomastoid strength is normal. No dysarthria is noted.  No obvious hearing deficits are noted.  Motor:  Muscle bulk is normal.   Tone is normal. Strength is  5 / 5 in arms anf proximal legs but 4/5 in left EHL and 4+/5 in left ankle extensors and Right EHL.  .   Sensory: Sensory testing is intact to pinprick, soft touch and vibration sensation in the arms butr she has reduced sensation to vibration in both feet to about 10% in her toes compared to ankles/knees  and reduced touch sensation in dorsum of the left foot more than the lateral aspecte.    Coordination: Cerebellar testing reveals good finger-nose-finger and heel-to-shin bilaterally.  Gait and station: Station is normal.   Gait  is normal. Tandem gait is slightly  wide. Romberg is negative  Reflexes: Deep tendon reflexes are symmetric and 2+ in arms and knees and 1 at ankles.   Plantar responses are flexor.       ASSESSMENT AND PLAN  Polyneuropathy  Rheumatoid arthritis, involving unspecified site, unspecified whether rheumatoid factor present (HCC)  Neck pain  Numbness  Cervical spinal stenosis  Left leg pain   1.   The dysesthesias in her feet could be due to a combination of 2 processes.  She appears to have a mild polyneuropathy.  Additionally, there appears to be superimposed L5 radiculopathy on the left. 2.  I would increase the gabapentin from 300 mg nightly up to dose of 300-300-600 (1200 mg daily). 3.  If no improvement she will let us know and we will consider repeating the NCV/EMG and referring back to neurosurgery with evidence of L5 radiculopathy.   Maziyah Vessel A. Epimenio Foot, MD, [facility] 12/12/2020, 6:18 PM Certified in Neurology, Clinical Neurophysiology, Sleep Medicine and Neuroimaging  [facility] [address], [address] [place], [place] [address] [phone]

## 2021-01-01 ENCOUNTER — Ambulatory Visit: Payer: Managed Care, Other (non HMO)

## 2021-01-09 NOTE — Progress Notes (Signed)
Cardiology Office Note   Date:  01/10/2021   ID:  Melissa Fritz, DOB 05-Jun-1961, MRN [id number]  PCP:  Merri Brunette, MD    No chief complaint on file.  AFib  Wt Readings from Last 3 Encounters:  01/10/21 179 lb 3.2 oz (81.3 kg)  12/12/20 180 lb 8 oz (81.9 kg)  01/15/20 175 lb (79.4 kg)       History of Present Illness: Melissa Fritz is a 60 y.o. female   who has had AFib in the past, and other arrhythmias when she was very young. She was hospitalized at a young age for arrhythmic issues. She had a failed ablation at age 18 at [facility], Dr. Cloyde Reams [phone]).  In 2018,She notedthat she has palpitations if she misses her diltiazem.   Foot pain, foot drop, nerve issues,  spinal stenosis limits walking.    She has avoided COVID in the past year.  Denies : exertional Chest pain. Dizziness. Leg edema. Nitroglycerin use. Orthopnea. Palpitations. Paroxysmal nocturnal dyspnea. Shortness of breath. Syncope.    Walking some for exercise.  No exertional chest pain.  No recent falls.      Past Medical History:  Diagnosis Date  . Arrhythmia    afib  . DDD (degenerative disc disease)   . Family history of breast cancer   . Fibrocystic breast disease   . History of colonoscopy   . History of nuclear stress test    Myoview 12/17:  EF 62, normal perfusion; Low Risk    Past Surgical History:  Procedure Laterality Date  . BOWEL RESECTION     adhesions causing bloackage  . BREAST BIOPSY Left 2019  . BREAST BIOPSY Left 2018  . COLONOSCOPY  7/14  . KNEE SURGERY Right   . PARTIAL HYSTERECTOMY     one ovary remains  . ROTATOR CUFF REPAIR       Current Outpatient Medications  Medication Sig Dispense Refill  . aspirin 81 MG tablet Take 81 mg by mouth daily. STOPPED TAKING UNTIL AFTER SURGERY AS OF 10/12/16    . Cyanocobalamin (VITAMIN B-12) 1000 MCG/15ML LIQD Take 1 mL by mouth every 21 ( twenty-one) days.    Marland Kitchen diltiazem (CARDIZEM CD) 180 MG 24 hr capsule Take 1  capsule (180 mg total) by mouth daily. 90 capsule 3  . docusate sodium (COLACE) 100 MG capsule Take 1 capsule by mouth 3 (three) times daily.    Marland Kitchen gabapentin (NEURONTIN) 300 MG capsule One po qAM, one po qPM and two po qHS 120 capsule 5  . hydroxychloroquine (PLAQUENIL) 200 MG tablet Take 200 mg by mouth 2 (two) times daily.    Marland Kitchen ibuprofen (ADVIL,MOTRIN) 800 MG tablet TK 1 T PO TID WF OR MILK PRN    . Multiple Vitamins-Minerals (MULTIVITAMIN PO) Take 1 tablet by mouth daily. STOPPED TAKING UNTIL AFTER SURGERY AS 10/12/16    . oxyCODONE-acetaminophen (PERCOCET/ROXICET) 5-325 MG tablet Take 1-2 tablets by mouth every 8 (eight) hours as needed for moderate pain or severe pain.    Marland Kitchen sertraline (ZOLOFT) 50 MG tablet Take 50 mg by mouth daily.    . vitamin E 400 UNIT capsule Take 400 Units by mouth daily.     No current facility-administered medications for this visit.    Allergies:   Demerol [meperidine], Brimonidine, and Brinzolamide-brimonidine    Social History:  The patient  reports that she has never smoked. She has never used smokeless tobacco. She reports current alcohol use. She reports  that she does not use drugs.   Family History:  The patient's family history includes Atrial fibrillation in her brother; Breast cancer in her maternal grandmother and mother; CVA in her paternal grandfather; Glaucoma in her father; Heart disease in her brother and brother; Polymyalgia rheumatica in her mother and sister.    ROS:  Please see the history of present illness.   Otherwise, review of systems are positive for difficulty losing weight.   All other systems are reviewed and negative.    PHYSICAL EXAM: VS:  BP 124/78   Pulse 69   Ht 6' (1.829 m)   Wt 179 lb 3.2 oz (81.3 kg)   SpO2 94%   BMI 24.30 kg/m  , BMI Body mass index is 24.3 kg/m. GEN: Well nourished, well developed, in no acute distress  HEENT: normal  Neck: no JVD, carotid bruits, or masses Cardiac: RRR; no murmurs, rubs, or  gallops,no edema  Respiratory:  clear to auscultation bilaterally, normal work of breathing GI: soft, nontender, nondistended, + BS MS: no deformity or atrophy  Skin: warm and dry, no rash Neuro:  Strength and sensation are intact Psych: euthymic mood, full affect   EKG:   The ekg ordered today demonstrates normal   Recent Labs: No results found for requested labs within last 8760 hours.   Lipid Panel No results found for: CHOL, TRIG, HDL, CHOLHDL, VLDL, LDLCALC, LDLDIRECT   Other studies Reviewed: Additional studies/ records that were reviewed today with results demonstrating: 2012 echo showed normal LV function and valvular function. Mild MR, tr AI.   ASSESSMENT AND PLAN:  1. PAF: Maintaining NSR. The current medical regimen is effective;  continue present plan and medications.  No palpitations.  2. Mitral regurgitation/Aortic insufficiency:  No CHF.  2012 echo result reviewed 3. Venous insufficiency: Followed by [facility].  Elevate legs.  4. We spoke about healthy diet and regular exercise. Focus more on the diet and exercise and not so much the number on the scale.  Increase fiber intake.  Trend toward plant based diet.     Current medicines are reviewed at length with the patient today.  The patient concerns regarding her medicines were addressed.  The following changes have been made:  No change  Labs/ tests ordered today include:  No orders of the defined types were placed in this encounter.   Recommend 150 minutes/week of aerobic exercise Low fat, low carb, high fiber diet recommended  Disposition:   FU in 1 year   Signed, Lance Muss, MD  01/10/2021 8:08 AM    [facility] [address] [place], [place], [place]  [address] Phone: [phone]; Fax: [phone]

## 2021-01-10 ENCOUNTER — Ambulatory Visit (INDEPENDENT_AMBULATORY_CARE_PROVIDER_SITE_OTHER): Payer: Managed Care, Other (non HMO) | Admitting: Interventional Cardiology

## 2021-01-10 ENCOUNTER — Encounter: Payer: Self-pay | Admitting: Interventional Cardiology

## 2021-01-10 ENCOUNTER — Other Ambulatory Visit: Payer: Self-pay

## 2021-01-10 VITALS — BP 124/78 | HR 69 | Ht 72.0 in | Wt 179.2 lb

## 2021-01-10 DIAGNOSIS — I872 Venous insufficiency (chronic) (peripheral): Secondary | ICD-10-CM | POA: Diagnosis not present

## 2021-01-10 DIAGNOSIS — I340 Nonrheumatic mitral (valve) insufficiency: Secondary | ICD-10-CM

## 2021-01-10 DIAGNOSIS — I351 Nonrheumatic aortic (valve) insufficiency: Secondary | ICD-10-CM

## 2021-01-10 DIAGNOSIS — I480 Paroxysmal atrial fibrillation: Secondary | ICD-10-CM

## 2021-01-10 NOTE — Patient Instructions (Signed)
Medication Instructions:  Your physician recommends that you continue on your current medications as directed. Please refer to the Current Medication list given to you today.  *If you need a refill on your cardiac medications before your next appointment, please call your pharmacy*   Lab Work: none If you have labs (blood work) drawn today and your tests are completely normal, you will receive your results only by: . MyChart Message (if you have MyChart) OR . A paper copy in the mail If you have any lab test that is abnormal or we need to change your treatment, we will call you to review the results.   Testing/Procedures: none   Follow-Up: At [facility], you and your health needs are our priority.  As part of our continuing mission to provide you with exceptional heart care, we have created designated Provider Care Teams.  These Care Teams include your primary Cardiologist (physician) and Advanced Practice Providers (APPs -  Physician Assistants and Nurse Practitioners) who all work together to provide you with the care you need, when you need it.  We recommend signing up for the patient portal called "MyChart".  Sign up information is provided on this After Visit Summary.  MyChart is used to connect with patients for Virtual Visits (Telemedicine).  Patients are able to view lab/test results, encounter notes, upcoming appointments, etc.  Non-urgent messages can be sent to your provider as well.   To learn more about what you can do with MyChart, go to [email or web address].    Your next appointment:   12 month(s)  The format for your next appointment:   In Person  Provider:   You may see Jayadeep Varanasi, MD or one of the following Advanced Practice Providers on your designated Care Team:    Dayna Dunn, PA-C  Michele Lenze, PA-C    Other Instructions  High-Fiber Eating Plan Fiber, also called dietary fiber, is a type of carbohydrate. It is found foods such as fruits,  vegetables, whole grains, and beans. A high-fiber diet can have many health benefits. Your health care provider may recommend a high-fiber diet to help:  Prevent constipation. Fiber can make your bowel movements more regular.  Lower your cholesterol.  Relieve the following conditions: ? Inflammation of veins in the anus (hemorrhoids). ? Inflammation of specific areas of the digestive tract (uncomplicated diverticulosis). ? A problem of the large intestine, also called the colon, that sometimes causes pain and diarrhea (irritable bowel syndrome, or IBS).  Prevent overeating as part of a weight-loss plan.  Prevent heart disease, type 2 diabetes, and certain cancers. What are tips for following this plan? Reading food labels  Check the nutrition facts label on food products for the amount of dietary fiber. Choose foods that have 5 grams of fiber or more per serving.  The goals for recommended daily fiber intake include: ? Men (age 50 or younger): 34-38 g. ? Men (over age 50): 28-34 g. ? Women (age 50 or younger): 25-28 g. ? Women (over age 50): 22-25 g. Your daily fiber goal is _____________ g.   Shopping  Choose whole fruits and vegetables instead of processed forms, such as apple juice or applesauce.  Choose a wide variety of high-fiber foods such as avocados, lentils, oats, and kidney beans.  Read the nutrition facts label of the foods you choose. Be aware of foods with added fiber. These foods often have high sugar and sodium amounts per serving. Cooking  Use whole-grain flour for baking and cooking.    Cook with brown rice instead of white rice. Meal planning  Start the day with a breakfast that is high in fiber, such as a cereal that contains 5 g of fiber or more per serving.  Eat breads and cereals that are made with whole-grain flour instead of refined flour or white flour.  Eat brown rice, bulgur wheat, or millet instead of white rice.  Use beans in place of meat in  soups, salads, and pasta dishes.  Be sure that half of the grains you eat each day are whole grains. General information  You can get the recommended daily intake of dietary fiber by: ? Eating a variety of fruits, vegetables, grains, nuts, and beans. ? Taking a fiber supplement if you are not able to take in enough fiber in your diet. It is better to get fiber through food than from a supplement.  Gradually increase how much fiber you consume. If you increase your intake of dietary fiber too quickly, you may have bloating, cramping, or gas.  Drink plenty of water to help you digest fiber.  Choose high-fiber snacks, such as berries, raw vegetables, nuts, and popcorn. What foods should I eat? Fruits Berries. Pears. Apples. Oranges. Avocado. Prunes and raisins. Dried figs. Vegetables Sweet potatoes. Spinach. Kale. Artichokes. Cabbage. Broccoli. Cauliflower. Green peas. Carrots. Squash. Grains Whole-grain breads. Multigrain cereal. Oats and oatmeal. Brown rice. Barley. Bulgur wheat. Millet. Quinoa. Bran muffins. Popcorn. Rye wafer crackers. Meats and other proteins Navy beans, kidney beans, and pinto beans. Soybeans. Split peas. Lentils. Nuts and seeds. Dairy Fiber-fortified yogurt. Beverages Fiber-fortified soy milk. Fiber-fortified orange juice. Other foods Fiber bars. The items listed above may not be a complete list of recommended foods and beverages. Contact a dietitian for more information. What foods should I avoid? Fruits Fruit juice. Cooked, strained fruit. Vegetables Fried potatoes. Canned vegetables. Well-cooked vegetables. Grains White bread. Pasta made with refined flour. White rice. Meats and other proteins Fatty cuts of meat. Fried chicken or fried fish. Dairy Milk. Yogurt. Cream cheese. Sour cream. Fats and oils Butters. Beverages Soft drinks. Other foods Cakes and pastries. The items listed above may not be a complete list of foods and beverages to avoid.  Talk with your dietitian about what choices are best for you. Summary  Fiber is a type of carbohydrate. It is found in foods such as fruits, vegetables, whole grains, and beans.  A high-fiber diet has many benefits. It can help to prevent constipation, lower blood cholesterol, aid weight loss, and reduce your risk of heart disease, diabetes, and certain cancers.  Increase your intake of fiber gradually. Increasing fiber too quickly may cause cramping, bloating, and gas. Drink plenty of water while you increase the amount of fiber you consume.  The best sources of fiber include whole fruits and vegetables, whole grains, nuts, seeds, and beans. This information is not intended to replace advice given to you by your health care provider. Make sure you discuss any questions you have with your health care provider. Document Revised: 02/29/2020 Document Reviewed: 02/29/2020 Elsevier Patient Education  2021 Elsevier Inc.   

## 2021-01-28 ENCOUNTER — Other Ambulatory Visit: Payer: Self-pay | Admitting: *Deleted

## 2021-01-28 MED ORDER — DILTIAZEM HCL ER COATED BEADS 180 MG PO CP24: 180.0000 mg | ORAL_CAPSULE | Freq: Every day | ORAL | 3 refills | Status: DC

## 2021-02-27 ENCOUNTER — Ambulatory Visit
Admission: RE | Admit: 2021-02-27 | Discharge: 2021-02-27 | Disposition: A | Discharge status: Home / Self Care | Payer: Managed Care, Other (non HMO) | Source: Ambulatory Visit | Attending: Family Medicine | Admitting: Family Medicine

## 2021-02-27 ENCOUNTER — Other Ambulatory Visit: Payer: Self-pay

## 2021-02-27 ENCOUNTER — Other Ambulatory Visit: Payer: Self-pay | Admitting: Family Medicine

## 2021-02-27 DIAGNOSIS — N63 Unspecified lump in unspecified breast: Secondary | ICD-10-CM

## 2021-02-27 DIAGNOSIS — Z1231 Encounter for screening mammogram for malignant neoplasm of breast: Secondary | ICD-10-CM

## 2021-05-01 ENCOUNTER — Ambulatory Visit
Admission: RE | Admit: 2021-05-01 | Discharge: 2021-05-01 | Disposition: A | Discharge status: Home / Self Care | Payer: Managed Care, Other (non HMO) | Source: Ambulatory Visit | Attending: Family Medicine | Admitting: Family Medicine

## 2021-05-01 ENCOUNTER — Other Ambulatory Visit: Payer: Self-pay

## 2021-05-01 ENCOUNTER — Other Ambulatory Visit: Payer: Self-pay | Admitting: Family Medicine

## 2021-05-01 DIAGNOSIS — R059 Cough, unspecified: Secondary | ICD-10-CM

## 2021-11-17 ENCOUNTER — Other Ambulatory Visit: Payer: Self-pay

## 2021-11-17 MED ORDER — DILTIAZEM HCL ER COATED BEADS 180 MG PO CP24: 180.0000 mg | ORAL_CAPSULE | Freq: Every day | ORAL | 1 refills | Status: DC

## 2021-11-18 ENCOUNTER — Telehealth: Payer: Self-pay | Admitting: Neurology

## 2021-11-18 MED ORDER — GABAPENTIN 300 MG PO CAPS: ORAL_CAPSULE | ORAL | 5 refills | Status: AC

## 2021-11-18 NOTE — Telephone Encounter (Signed)
Pt called requesting refill for gabapentin (NEURONTIN) 300 MG capsule. Pt wants to use the mail order service through her [employer].

## 2021-11-18 NOTE — Telephone Encounter (Signed)
Refill sent to the mail service pharmacy [facility] on file which [facility]

## 2022-03-06 ENCOUNTER — Ambulatory Visit: Payer: Managed Care, Other (non HMO) | Admitting: Interventional Cardiology

## 2022-03-11 ENCOUNTER — Ambulatory Visit: Payer: Managed Care, Other (non HMO) | Admitting: Interventional Cardiology

## 2022-04-08 ENCOUNTER — Ambulatory Visit: Payer: Managed Care, Other (non HMO) | Admitting: Physician Assistant

## 2022-06-05 NOTE — Progress Notes (Unsigned)
Cardiology Office Note   Date:  06/08/2022   ID:  Melissa, Fritz 12-04-1960, MRN [id number]  PCP:  Melissa Brunette, MD    No chief complaint on file.  AFib  Wt Readings from Last 3 Encounters:  06/08/22 174 lb (78.9 kg)  01/10/21 179 lb 3.2 oz (81.3 kg)  12/12/20 180 lb 8 oz (81.9 kg)       History of Present Illness: Melissa Fritz is a 61 y.o. female  who has had AFib in the past, and other arrhythmias when she was very young. She was hospitalized at a young age for arrhythmic issues. She had a failed ablation at age 81 at [facility], Dr. Cloyde Fritz [phone]).     In 2018, She noted that she has palpitations if she misses her diltiazem.   Foot pain, foot drop, nerve issues,  spinal stenosis limits walking.     Denies : exertional Chest pain. Dizziness. Leg edema. Nitroglycerin use. Orthopnea. Palpitations. Paroxysmal nocturnal dyspnea. Shortness of breath. Syncope.   Has some random chest pain.  No relation to walking or other exertion. Rheumatoid arthritis. Walking is main exercise.   Brother had a stent placed in his 52s.   Past Medical History:  Diagnosis Date   Arrhythmia    afib   DDD (degenerative disc disease)    Family history of breast cancer    Fibrocystic breast disease    History of colonoscopy    History of nuclear stress test    Myoview 12/17:  EF 62, normal perfusion; Low Risk    Past Surgical History:  Procedure Laterality Date   BOWEL RESECTION     adhesions causing bloackage   BREAST BIOPSY Left 2019   BREAST BIOPSY Left 2018   COLONOSCOPY  7/14   KNEE SURGERY Right    PARTIAL HYSTERECTOMY     one ovary remains   ROTATOR CUFF REPAIR       Current Outpatient Medications  Medication Sig Dispense Refill   aspirin 81 MG tablet Take 81 mg by mouth daily. STOPPED TAKING UNTIL AFTER SURGERY AS OF 10/12/16     Cyanocobalamin (VITAMIN B-12) 1000 MCG/15ML LIQD Take 1 mL by mouth every 21 ( twenty-one) days.     diltiazem (CARDIZEM CD) 180 MG 24  hr capsule Take 1 capsule (180 mg total) by mouth daily. 90 capsule 1   gabapentin (NEURONTIN) 300 MG capsule One po qAM, one po qPM and two po qHS 120 capsule 5   hydroxychloroquine (PLAQUENIL) 200 MG tablet Take 200 mg by mouth 2 (two) times daily.     ibuprofen (ADVIL,MOTRIN) 800 MG tablet TK 1 T PO TID WF OR MILK PRN     Multiple Vitamins-Minerals (MULTIVITAMIN PO) Take 1 tablet by mouth daily. STOPPED TAKING UNTIL AFTER SURGERY AS 10/12/16     oxyCODONE-acetaminophen (PERCOCET/ROXICET) 5-325 MG tablet Take 1-2 tablets by mouth every 8 (eight) hours as needed for moderate pain or severe pain.     sertraline (ZOLOFT) 50 MG tablet Take 50 mg by mouth daily.     vitamin E 400 UNIT capsule Take 400 Units by mouth daily.     docusate sodium (COLACE) 100 MG capsule Take 1 capsule by mouth 3 (three) times daily. (Patient not taking: Reported on 06/08/2022)     No current facility-administered medications for this visit.    Allergies:   Demerol [meperidine], Brimonidine, and Brinzolamide-brimonidine    Social History:  The patient  reports that she has never  smoked. She has never used smokeless tobacco. She reports current alcohol use. She reports that she does not use drugs.   Family History:  The patient's family history includes Atrial fibrillation in her brother; Breast cancer in her maternal grandmother and mother; CVA in her paternal grandfather; Glaucoma in her father; Heart disease in her brother and brother; Polymyalgia rheumatica in her mother and sister.    ROS:  Please see the history of present illness.   Otherwise, review of systems are positive for foot pain.   All other systems are reviewed and negative.    PHYSICAL EXAM: VS:  BP 122/80   Pulse 70   Ht 6' (1.829 m)   Wt 174 lb (78.9 kg)   SpO2 95%   BMI 23.60 kg/m  , BMI Body mass index is 23.6 kg/m. GEN: Well nourished, well developed, in no acute distress HEENT: normal Neck: no JVD, carotid bruits, or masses Cardiac:  RRR; no murmurs, rubs, or gallops,no edema  Respiratory:  clear to auscultation bilaterally, normal work of breathing GI: soft, nontender, nondistended, + BS MS: no deformity or atrophy Skin: warm and dry, no rash Neuro:  Strength and sensation are intact Psych: euthymic mood, full affect   EKG:   The ekg ordered today demonstrates NSR, no ST changes   Recent Labs: No results found for requested labs within last 365 days.   Lipid Panel No results found for: "CHOL", "TRIG", "HDL", "CHOLHDL", "VLDL", "LDLCALC", "LDLDIRECT"   Other studies Reviewed: Additional studies/ records that were reviewed today with results demonstrating: Normal hemoglobin 13.9 and creatinine 1.06 in February 2023.  Potassium 5.0 in October 2022.   ASSESSMENT AND PLAN:  PAF: In NSR.  Continue diltiazem. Precordial chest pain: some typical and some atypical features.  Family h/o CAD in brother.  He was relatively young.  We will check CTA coronaries to evaluate for coronary artery disease.  Given that she is already on a moderate dose of diltiazem, will give metoprolol 50 mg prior to CTA.  Her resting heart rate today was 70. MR/AI: No signs of CHF on exam. Venous insufficiency: We talked leg elevation and trying low potency compression stockings. Now living in [place].  She prefers to get her care here in [place].   Current medicines are reviewed at length with the patient today.  The patient concerns regarding her medicines were addressed.  The following changes have been made:  No change  Labs/ tests ordered today include: CTA coronaries No orders of the defined types were placed in this encounter.   Recommend 150 minutes/week of aerobic exercise Low fat, low carb, high fiber diet recommended  Disposition:   FU in 1 year or sooner if CTA is abnormal   Signed, Melissa Muss, MD  06/08/2022 1:39 PM    [facility] [address] [place], [place], [place]  [address] Phone: [phone]; Fax: [phone]

## 2022-06-08 ENCOUNTER — Encounter: Payer: Self-pay | Admitting: Interventional Cardiology

## 2022-06-08 ENCOUNTER — Ambulatory Visit (INDEPENDENT_AMBULATORY_CARE_PROVIDER_SITE_OTHER): Payer: Managed Care, Other (non HMO) | Admitting: Interventional Cardiology

## 2022-06-08 VITALS — BP 122/80 | HR 70 | Ht 72.0 in | Wt 174.0 lb

## 2022-06-08 DIAGNOSIS — R072 Precordial pain: Secondary | ICD-10-CM | POA: Diagnosis not present

## 2022-06-08 DIAGNOSIS — I351 Nonrheumatic aortic (valve) insufficiency: Secondary | ICD-10-CM

## 2022-06-08 DIAGNOSIS — I872 Venous insufficiency (chronic) (peripheral): Secondary | ICD-10-CM | POA: Diagnosis not present

## 2022-06-08 DIAGNOSIS — I480 Paroxysmal atrial fibrillation: Secondary | ICD-10-CM

## 2022-06-08 DIAGNOSIS — I340 Nonrheumatic mitral (valve) insufficiency: Secondary | ICD-10-CM

## 2022-06-08 MED ORDER — DILTIAZEM HCL ER COATED BEADS 180 MG PO CP24: 180.0000 mg | ORAL_CAPSULE | Freq: Every day | ORAL | 3 refills | Status: DC

## 2022-06-08 MED ORDER — METOPROLOL TARTRATE 50 MG PO TABS: ORAL_TABLET | ORAL | 0 refills | Status: DC

## 2022-06-08 NOTE — Patient Instructions (Signed)
Medication Instructions:  ?Your physician recommends that you continue on your current medications as directed. Please refer to the Current Medication list given to you today. ? ?*If you need a refill on your cardiac medications before your next appointment, please call your pharmacy* ? ? ?Lab Work: ?Lab work to be done today--BMP ?If you have labs (blood work) drawn today and your tests are completely normal, you will receive your results only by: ?MyChart Message (if you have MyChart) OR ?A paper copy in the mail ?If you have any lab test that is abnormal or we need to change your treatment, we will call you to review the results. ? ? ?Testing/Procedures: ?Your physician has requested that you have cardiac CT. Cardiac computed tomography (CT) is a painless test that uses an x-ray machine to take clear, detailed pictures of your heart. For further information please visit www.cardiosmart.org. Please follow instruction sheet as given. ? ? ? ? ?Follow-Up: ?At [facility], you and your health needs are our priority.  As part of our continuing mission to provide you with exceptional heart care, we have created designated Provider Care Teams.  These Care Teams include your primary Cardiologist (physician) and Advanced Practice Providers (APPs -  Physician Assistants and Nurse Practitioners) who all work together to provide you with the care you need, when you need it. ? ?We recommend signing up for the patient portal called "MyChart".  Sign up information is provided on this After Visit Summary.  MyChart is used to connect with patients for Virtual Visits (Telemedicine).  Patients are able to view lab/test results, encounter notes, upcoming appointments, etc.  Non-urgent messages can be sent to your provider as well.   ?To learn more about what you can do with MyChart, go to [email or web address].   ? ?Your next appointment:   ?12 month(s) ? ?The format for your next appointment:   ?In Person ? ?Provider:    ?Jayadeep Varanasi, MD   ? ? ?Other Instructions ? ? ?Your cardiac CT will be scheduled at one of the below locations:  ? ?[facility] ?[address] ?[place], [place] [address] ?[phone] ? ?OR ? ?[facility] ?[address] ?[address] ?[place], [place] [address] ?[phone] ? ?If scheduled at [facility], please arrive at the Women's and Children's Entrance (Entrance C2) of [facility] 30 minutes prior to test start time. ?You can use the FREE valet parking offered at entrance C (encouraged to control the heart rate for the test)  ?Proceed to the [facility] Department (first floor) to check-in and test prep. ? ?All radiology patients and guests should use entrance C2 at [facility], accessed from [address], even though the hospital's physical address listed is [address]. ? ? ? ?If scheduled at [facility], please arrive 15 mins early for check-in and test prep. ? ?Please follow these instructions carefully (unless otherwise directed): ? ?Hold all erectile dysfunction medications at least 3 days (72 hrs) prior to test. ? ?On the Night Before the Test: ?Be sure to Drink plenty of water. ?Do not consume any caffeinated/decaffeinated beverages or chocolate 12 hours prior to your test. ?Do not take any antihistamines 12 hours prior to your test. ? ? ?On the Day of the Test: ?Drink plenty of water until 1 hour prior to the test. ?Do not eat any food 4 hours prior to the test. ?You may take your regular medications prior to   the test.  ?Take metoprolol (Lopressor) two hours prior to test. ?HOLD Furosemide/Hydrochlorothiazide morning of the test. ?FEMALES- please wear underwire-free bra if available, avoid dresses & tight clothing ? ?     ?After the Test: ?Drink plenty of water. ?After receiving IV contrast, you may experience a mild flushed feeling. This is normal. ?On occasion, you  may experience a mild rash up to 24 hours after the test. This is not dangerous. If this occurs, you can take Benadryl 25 mg and increase your fluid intake. ?If you experience trouble breathing, this can be serious. If it is severe call 911 IMMEDIATELY. If it is mild, please call our office. ?If you take any of these medications: Glipizide/Metformin, Avandament, Glucavance, please do not take 48 hours after completing test unless otherwise instructed. ? ?We will call to schedule your test 2-4 weeks out understanding that some insurance companies will need an authorization prior to the service being performed.  ? ?For non-scheduling related questions, please contact the cardiac imaging nurse navigator should you have any questions/concerns: ?Sara Wallace, Cardiac Imaging Nurse Navigator ?Merle Prescott, Cardiac Imaging Nurse Navigator ? [facility] ?Direct Office Dial: [phone]  ? ?For scheduling needs, including cancellations and rescheduling, please call Brittany, [phone]. ? ? ?Important Information About Sugar ? ? ? ? ? ? ?

## 2022-06-09 LAB — BASIC METABOLIC PANEL
BUN/Creatinine Ratio: 12 (ref 12–28)
BUN: 13 mg/dL (ref 8–27)
CO2: 23 mmol/L (ref 20–29)
Calcium: 9.4 mg/dL (ref 8.7–10.3)
Chloride: 106 mmol/L (ref 96–106)
Creatinine, Ser: 1.07 mg/dL — ABNORMAL HIGH (ref 0.57–1.00)
Glucose: 83 mg/dL (ref 70–99)
Potassium: 5.2 mmol/L (ref 3.5–5.2)
Sodium: 143 mmol/L (ref 134–144)
eGFR: 59 mL/min/{1.73_m2} — ABNORMAL LOW (ref >59)

## 2022-06-15 ENCOUNTER — Other Ambulatory Visit: Payer: Self-pay | Admitting: Neurology

## 2022-06-16 ENCOUNTER — Telehealth: Payer: Self-pay | Admitting: Neurology

## 2022-06-16 NOTE — Telephone Encounter (Signed)
Pt is calling to request a refill on gabapentin (NEURONTIN) 300 MG capsule. Pt is requesting the prescription be sent to [facility] - [place], [place] - [phone]. Pt made an appointment with Dr. Epimenio Foot 12/01/22 @ 11:00 am

## 2022-06-16 NOTE — Telephone Encounter (Signed)
Called and spoke w/ pt. Advised since last visit 12/12/20, she will need to be seen first before we can refill. In the meantime, she can ask for refills from PCP.  Did make her aware that at last visit, Dr. Epimenio Foot put for her to only f/u if needed for new/worsening sx. She preferred to keep appt in January that was made w/ Dr. Epimenio Foot. She will contact Dr. Katrinka Blazing for refill on gabapentin until she is seen at our office. Recommended she not come off med until she discusses w/ either PCP or Dr. Epimenio Foot.

## 2022-08-04 ENCOUNTER — Telehealth: Payer: Self-pay | Admitting: *Deleted

## 2022-08-04 NOTE — Telephone Encounter (Addendum)
Cardiac CT has not been scheduled.  I checked with scheduling department and patient did not want to have scan.  I called patient to follow up and see if she wanted to schedule at this time.  Left message to call office.

## 2022-08-05 NOTE — Telephone Encounter (Signed)
Patient decided she didn't want to do the procedure. She states she called the hospital but forgot to call our office. Please advise

## 2022-08-05 NOTE — Telephone Encounter (Signed)
I spoke with patient. She has decided not to do CT at this time.  She is feeling good. No chest pain.  She will follow up with Dr Irish Lack in a year but will contact us if she wants to schedule CT prior to this.

## 2022-08-10 ENCOUNTER — Other Ambulatory Visit: Payer: Self-pay | Admitting: Family Medicine

## 2022-08-10 DIAGNOSIS — Z1231 Encounter for screening mammogram for malignant neoplasm of breast: Secondary | ICD-10-CM

## 2022-09-17 ENCOUNTER — Ambulatory Visit
Admission: RE | Admit: 2022-09-17 | Discharge: 2022-09-17 | Disposition: A | Discharge status: Home / Self Care | Payer: Managed Care, Other (non HMO) | Source: Ambulatory Visit | Attending: Family Medicine | Admitting: Family Medicine

## 2022-09-17 ENCOUNTER — Ambulatory Visit: Payer: Managed Care, Other (non HMO)

## 2022-09-17 DIAGNOSIS — Z1231 Encounter for screening mammogram for malignant neoplasm of breast: Secondary | ICD-10-CM

## 2022-12-01 ENCOUNTER — Ambulatory Visit: Payer: Managed Care, Other (non HMO) | Admitting: Neurology

## 2023-05-10 ENCOUNTER — Other Ambulatory Visit: Payer: Self-pay | Admitting: Interventional Cardiology

## 2023-09-08 ENCOUNTER — Ambulatory Visit: Payer: Managed Care, Other (non HMO) | Admitting: Interventional Cardiology

## 2023-11-08 ENCOUNTER — Other Ambulatory Visit: Payer: Self-pay | Admitting: Interventional Cardiology

## 2023-11-20 NOTE — Progress Notes (Deleted)
  Cardiology Office Note:  .   Date:  11/20/2023  ID:  [name], DOB Jun 27, 1961, MRN [id number] PCP: Claudene Pellet, MD  [facility] Providers Cardiologist:  Candyce Reek, MD { Click to update primary MD,subspecialty MD or APP then REFRESH:1}   History of Present Illness: .    Jan. 13, 2025  Melissa Fritz is a 63 y.o. female , previous patient of Dr. Reek.   She is seen for the first time today   She has a hx of atrial fib. She had a failed afib ablation at age 75 She has spinal stenosis that limits her ambulation   Family hx of CAD, brother had a stent placed   Dr. Reek ordered a coronary CTA - ( not done yet)       ROS: ***  Studies Reviewed: .        *** Risk Assessment/Calculations:   {Does this patient have ATRIAL FIBRILLATION?:[id number]} No BP recorded.  {Refresh Note OR Click here to enter BP  :1}***       Physical Exam:   VS:  There were no vitals taken for this visit.   Wt Readings from Last 3 Encounters:  06/08/22 174 lb (78.9 kg)  01/10/21 179 lb 3.2 oz (81.3 kg)  12/12/20 180 lb 8 oz (81.9 kg)    GEN: Well nourished, well developed in no acute distress NECK: No JVD; No carotid bruits CARDIAC: ***RRR, no murmurs, rubs, gallops RESPIRATORY:  Clear to auscultation without rales, wheezing or rhonchi  ABDOMEN: Soft, non-tender, non-distended EXTREMITIES:  No edema; No deformity   ASSESSMENT AND PLAN: .   ***    {Are you ordering a CV Procedure (e.g. stress test, cath, DCCV, TEE, etc)?   Press F2        :[id number]}  Dispo: ***  Signed, Aleene Passe, MD

## 2023-11-22 ENCOUNTER — Ambulatory Visit: Payer: Self-pay | Admitting: Cardiovascular Disease

## 2023-12-27 ENCOUNTER — Other Ambulatory Visit: Payer: Self-pay | Admitting: *Deleted

## 2023-12-27 ENCOUNTER — Telehealth: Payer: Self-pay | Admitting: Cardiovascular Disease

## 2023-12-27 MED ORDER — DILTIAZEM HCL ER COATED BEADS 180 MG PO CP24: 180.0000 mg | ORAL_CAPSULE | Freq: Every day | ORAL | 0 refills | Status: DC

## 2023-12-27 NOTE — Telephone Encounter (Signed)
 *  STAT* If patient is at the pharmacy, call can be transferred to refill team.   1. Which medications need to be refilled? (please list name of each medication and dose if known)   diltiazem (CARDIZEM CD) 180 MG 24 hr capsule   2. Which pharmacy/location (including street and city if local pharmacy) is medication to be sent to?  [id number] - [place], [place] - [address]   3. Do they need a 30 day or 90 day supply? 90 days

## 2024-02-01 ENCOUNTER — Ambulatory Visit: Payer: Self-pay | Admitting: Cardiovascular Disease

## 2024-03-06 NOTE — Progress Notes (Unsigned)
 Cardiology Office Note:  .   Date:  03/07/2024  ID:  Melissa Fritz, DOB 1961/02/12, MRN [id number] PCP: Faustina Hood, MD  [facility] Providers Cardiologist:  Avery Bodo, MD    History of Present Illness: .    March 07, 2024:  Melissa Fritz is a 63 y.o. female with a history of paroxysmal atrial fibrillation.  She is a previous patient of Dr. Jacquelynn Matter. I am meeting her today for the first time.  She now lives in [place], [place] ( [place] , [place] )   She has had arrhythmias for many years.  She had a failed ablation at age 70. She had lots of tachycardia as a child    Has occasional chest tightness,  might last a few seconds Is not associated with tachycardia   She had a myoview  study in 2017 that was normal    Not related to exercise  She walks regularly  No longer playing tennis due to a shoulder injury    Has neuropathy Is on gabapentin  and  oxycodone  Q 8 hours for back pain   Fam hx   1  brother had Afib  1 brother had CAD   We have met her and her husband , Melissa Fritz. She have an [place] place at [place] , [place]  Moved to [place], [place] during COVID    ROS:    Studies Reviewed: Aaron Aas   EKG Interpretation Date/Time:  Tuesday March 07 2024 09:30:30 EDT Ventricular Rate:  65 PR Interval:  122 QRS Duration:  90 QT Interval:  398 QTC Calculation: 413 R Axis:   73  Text Interpretation: Normal sinus rhythm Normal ECG No previous ECGs available Confirmed by Ahmad Alert ([id number]) on 03/07/2024 10:06:54 AM     Risk Assessment/Calculations:             Physical Exam:   VS:  BP 136/80   Pulse 71   Ht 6' (1.829 m)   Wt 170 lb (77.1 kg)   SpO2 99%   BMI 23.06 kg/m    Wt Readings from Last 3 Encounters:  03/07/24 170 lb (77.1 kg)  06/08/22 174 lb (78.9 kg)  01/10/21 179 lb 3.2 oz (81.3 kg)    GEN: Well nourished, well developed in no acute distress NECK: No JVD; No carotid bruits CARDIAC: RRR, soft systolic murmur at LSB , radiates to L  ax line  and slightly to RSB  RESPIRATORY:  Clear to auscultation without rales, wheezing or rhonchi  ABDOMEN: Soft, non-tender, non-distended EXTREMITIES:  No edema; No deformity   ASSESSMENT AND PLAN: .     Hx of atrial fib:  has rare palpitations.  That do not feel like atrial fib I wonder if she has AVNRT or another tachycardia that was contributing to her atrial fib   2.  Occasional chest pressure.  She has occasional chest pressure that last for only a few seconds.  She had a Myoview  study last year which was unremarkable.  At this time she does not think that the episodes of chest pressure warrant any further investigation.  She will let us  know if these things worsen.  3.  Systolic murmur   She has very mild mitral regurgitation and trivial AI by echo in 2012.    She has a soft systolic murmur on exam. I think she can wait and have a repeat echo once she is on medicare or if she develops worsening symptoms  Dispo: 1 year    Signed, Ahmad Alert, MD

## 2024-03-07 ENCOUNTER — Ambulatory Visit: Payer: Self-pay | Attending: Cardiovascular Disease | Admitting: Cardiovascular Disease

## 2024-03-07 ENCOUNTER — Encounter: Payer: Self-pay | Admitting: Cardiovascular Disease

## 2024-03-07 VITALS — BP 136/80 | HR 71 | Ht 72.0 in | Wt 170.0 lb

## 2024-03-07 DIAGNOSIS — Z09 Encounter for follow-up examination after completed treatment for conditions other than malignant neoplasm: Secondary | ICD-10-CM | POA: Diagnosis not present

## 2024-03-07 MED ORDER — DILTIAZEM HCL ER BEADS 180 MG PO CP24: 180.0000 mg | ORAL_CAPSULE | Freq: Every day | ORAL | 3 refills | Status: AC

## 2024-03-07 NOTE — Patient Instructions (Signed)
 Medication Instructions:  REFILLED Diltiazem  STOP Metoprolol  *If you need a refill on your cardiac medications before your next appointment, please call your pharmacy*  Follow-Up: At [facility], you and your health needs are our priority.  As part of our continuing mission to provide you with exceptional heart care, our providers are all part of one team.  This team includes your primary Cardiologist (physician) and Advanced Practice Providers or APPs (Physician Assistants and Nurse Practitioners) who all work together to provide you with the care you need, when you need it.  Your next appointment:   1 year(s)  Provider:   Ahmad Alert, MD

## 2024-11-23 ENCOUNTER — Other Ambulatory Visit: Payer: Self-pay | Admitting: Family Medicine

## 2024-11-23 DIAGNOSIS — Z1231 Encounter for screening mammogram for malignant neoplasm of breast: Secondary | ICD-10-CM

## 2024-12-13 ENCOUNTER — Ambulatory Visit
Admission: RE | Admit: 2024-12-13 | Discharge: 2024-12-13 | Disposition: A | Source: Ambulatory Visit | Attending: Family Medicine | Admitting: Family Medicine

## 2024-12-13 DIAGNOSIS — Z1231 Encounter for screening mammogram for malignant neoplasm of breast: Secondary | ICD-10-CM
